# Patient Record
Sex: Female | Born: 1995 | Race: White | Hispanic: No | Marital: Single | Smoking: Never smoker
Health system: Southern US, Community
[De-identification: ages and names within clinical notes are randomized; demographics above are authoritative.]

## PROBLEM LIST (undated history)

## (undated) DIAGNOSIS — F329 Major depressive disorder, single episode, unspecified: Secondary | ICD-10-CM

## (undated) DIAGNOSIS — B977 Papillomavirus as the cause of diseases classified elsewhere: Secondary | ICD-10-CM

## (undated) DIAGNOSIS — F32A Depression, unspecified: Secondary | ICD-10-CM

## (undated) DIAGNOSIS — F419 Anxiety disorder, unspecified: Secondary | ICD-10-CM

## (undated) DIAGNOSIS — E079 Disorder of thyroid, unspecified: Secondary | ICD-10-CM

## (undated) DIAGNOSIS — E119 Type 2 diabetes mellitus without complications: Secondary | ICD-10-CM

## (undated) HISTORY — DX: Anxiety disorder, unspecified: F41.9

## (undated) HISTORY — PX: INTRAUTERINE DEVICE INSERTION: SHX323

## (undated) HISTORY — DX: Major depressive disorder, single episode, unspecified: F32.9

## (undated) HISTORY — DX: Depression, unspecified: F32.A

## (undated) HISTORY — DX: Papillomavirus as the cause of diseases classified elsewhere: B97.7

---
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
## 2017-02-24 ENCOUNTER — Ambulatory Visit (INDEPENDENT_AMBULATORY_CARE_PROVIDER_SITE_OTHER): Payer: BLUE CROSS/BLUE SHIELD | Admitting: Certified Nurse Midwife

## 2017-02-24 ENCOUNTER — Other Ambulatory Visit (HOSPITAL_COMMUNITY)
Admission: RE | Admit: 2017-02-24 | Discharge: 2017-02-24 | Disposition: A | Discharge status: Home / Self Care | Payer: BLUE CROSS/BLUE SHIELD | Source: Ambulatory Visit | Attending: Certified Nurse Midwife | Admitting: Certified Nurse Midwife

## 2017-02-24 ENCOUNTER — Encounter: Payer: Self-pay | Admitting: Certified Nurse Midwife

## 2017-02-24 VITALS — BP 102/64 | HR 64 | Resp 16 | Ht 65.5 in | Wt 151.0 lb

## 2017-02-24 DIAGNOSIS — Z794 Long term (current) use of insulin: Secondary | ICD-10-CM | POA: Diagnosis not present

## 2017-02-24 DIAGNOSIS — B9689 Other specified bacterial agents as the cause of diseases classified elsewhere: Secondary | ICD-10-CM | POA: Insufficient documentation

## 2017-02-24 DIAGNOSIS — IMO0001 Reserved for inherently not codable concepts without codable children: Secondary | ICD-10-CM

## 2017-02-24 DIAGNOSIS — E119 Type 2 diabetes mellitus without complications: Secondary | ICD-10-CM | POA: Diagnosis not present

## 2017-02-24 DIAGNOSIS — Z01419 Encounter for gynecological examination (general) (routine) without abnormal findings: Secondary | ICD-10-CM | POA: Diagnosis not present

## 2017-02-24 DIAGNOSIS — Z0000 Encounter for general adult medical examination without abnormal findings: Secondary | ICD-10-CM

## 2017-02-24 DIAGNOSIS — N760 Acute vaginitis: Secondary | ICD-10-CM | POA: Diagnosis not present

## 2017-02-24 NOTE — Progress Notes (Signed)
21 y.o. G0P0000 Single  Caucasian Fe here to establish gyn care and  for annual exam. Periods are regular with only spotting with Kyleena IUD, inserted 7/17. Also requests STD screening, labs and oral screening. Patient is Juvenile  insulin dependent diabetic with onset at age 52. She is in good control with her diabetes. Patient is here in [place] for college at [employer]. Hypothyroid also being managed by Endocrine in [facility]. Patient would like to establish Endocrine care here, if needed. Denies change in vaginal discharge or vaginal concerns. No other concerns today.  Patient's last menstrual period was 02/19/2017 (exact date).          Sexually active: Yes.    The current method of family planning is IUD.    Exercising: No.  exercise Smoker:  no  Health Maintenance: Pap:  none History of Abnormal Pap: no MMG:  none Self Breast exams: no Colonoscopy:  none BMD:   none TDaP:  UTD Shingles: no Pneumonia: no Hep C and HIV: not done Labs: requested   reports that she has never smoked. She has never used smokeless tobacco. She reports that she does not drink alcohol or use drugs.  Past Medical History:  Diagnosis Date  . Anxiety   . Depression   . Diabetes mellitus without complication (HCC)   . Thyroid disease     History reviewed. No pertinent surgical history.  Current Outpatient Prescriptions  Medication Sig Dispense Refill  . buPROPion (WELLBUTRIN XL) 300 MG 24 hr tablet Take 300 mg by mouth daily.    Marland Kitchen ibuprofen (ADVIL,MOTRIN) 200 MG tablet Take 200-400 mg by mouth every 6 (six) hours as needed (pain).    . insulin aspart (NOVOLOG FLEXPEN) 100 UNIT/ML FlexPen Inject 1-4 Units into the skin 3 (three) times daily with meals. 15 mL 2  . Insulin Glargine (BASAGLAR KWIKPEN) 100 UNIT/ML SOPN Inject 0.22 mLs (22 Units total) into the skin daily. 3 mL 2  . Levonorgestrel (KYLEENA) 19.5 MG IUD by Intrauterine route.    Marland Kitchen SYNTHROID 175 MCG tablet Take 175 mg by mouth  daily.    Marland Kitchen VYVANSE 20 MG capsule Take 20 mg by mouth daily.     No current facility-administered medications for this visit.     History reviewed. No pertinent family history.  ROS:  Pertinent items are noted in HPI.  Otherwise, a comprehensive ROS was negative.  Exam:   BP 102/64   Pulse 64   Resp 16   Ht 5' 5.5" (1.664 m)   Wt 151 lb (68.5 kg)   LMP 02/19/2017 (Exact Date)   BMI 24.75 kg/m  Height: 5' 5.5" (166.4 cm) Ht Readings from Last 3 Encounters:  02/24/17 5' 5.5" (1.664 m) (68 %, Z= 0.48)*  11/01/16 5\' 6"  (1.676 m) (75 %, Z= 0.68)*   * Growth percentiles are based on CDC 2-20 Years data.    General appearance: alert, cooperative and appears stated age Head: Normocephalic, without obvious abnormality, atraumatic Neck: no adenopathy, supple, symmetrical, trachea midline and thyroid normal to inspection and palpation Lungs: clear to auscultation bilaterally Breasts: normal appearance, no masses or tenderness, No nipple retraction or dimpling, No nipple discharge or bleeding, No axillary or supraclavicular adenopathy, Taught monthly breast self examination Heart: regular rate and rhythm Abdomen: soft, non-tender; no masses,  no organomegaly Extremities: extremities normal, atraumatic, no cyanosis or edema Skin: Skin color, texture, turgor normal. No rashes or lesions Lymph nodes: Cervical, supraclavicular, and axillary nodes normal. No abnormal inguinal nodes  palpated Neurologic: Grossly normal   Pelvic: External genitalia:  no lesions              Urethra:  normal appearing urethra with no masses, tenderness or lesions              Bartholin's and Skene's: normal                 Vagina: normal appearing vagina with normal color and discharge, no lesions              Cervix: no cervical motion tenderness, no lesions and nulliparous appearance              Pap taken: No. Bimanual Exam:  Uterus:  normal size, contour, position, consistency, mobility, non-tender and  anteverted              Adnexa: normal adnexa and no mass, fullness, tenderness               Rectovaginal: Confirms               Anus:  normal appearance  Chaperone present: yes  A:  Well Woman with normal exam  Contraception Kyleena IUD inserted 7/17  Insulin dependent Juvenile onset diabetes well controlled  Hypothyroid with Endocrine management  Screening labs  P:   Reviewed health and wellness pertinent to exam  Warning signs with IUD reviewed with bleeding expectations  Discussed with patient care here while in college and patient requesting Endocrine referral for establishment of care if problems with diabetes or thyroid. Patient will be called with referral appointment. If has problem before seen, needs to go ER or 911.  Labs: STD panel, Hep C, GC,Chlamydia vaginal, oral, Yeast, BV and trichomonas  Pap smear: no   counseled on breast self exam, STD prevention, HIV risk factors and prevention, adequate intake of calcium and vitamin D, diet and exercise  return annually or prn  An After Visit Summary was printed and given to the patient.

## 2017-02-24 NOTE — Patient Instructions (Signed)
General topics  Next pap or exam is  due in 1 year Take a Women's multivitamin Take 1200 mg. of calcium daily - prefer dietary If any concerns in interim to call back  Breast Self-Awareness Practicing breast self-awareness may pick up problems early, prevent significant medical complications, and possibly save your life. By practicing breast self-awareness, you can become familiar with how your breasts look and feel and if your breasts are changing. This allows you to notice changes early. It can also offer you some reassurance that your breast health is good. One way to learn what is normal for your breasts and whether your breasts are changing is to do a breast self-exam. If you find a lump or something that was not present in the past, it is best to contact your caregiver right away. Other findings that should be evaluated by your caregiver include nipple discharge, especially if it is bloody; skin changes or reddening; areas where the skin seems to be pulled in (retracted); or new lumps and bumps. Breast pain is seldom associated with cancer (malignancy), but should also be evaluated by a caregiver. BREAST SELF-EXAM The best time to examine your breasts is 5 7 days after your menstrual period is over.  ExitCare Patient Information 2013 [employer] LLC.   Exercise to Stay Healthy Exercise helps you become and stay healthy. EXERCISE IDEAS AND TIPS Choose exercises that:  You enjoy.  Fit into your day. You do not need to exercise really hard to be healthy. You can do exercises at a slow or medium level and stay healthy. You can:  Stretch before and after working out.  Try yoga, Pilates, or tai chi.  Lift weights.  Walk fast, swim, jog, run, climb stairs, bicycle, dance, or rollerskate.  Take aerobic classes. Exercises that burn about 150 calories:  Running 1  miles in 15 minutes.  Playing volleyball for 45 to 60 minutes.  Washing and waxing a car for 45 to 60  minutes.  Playing touch football for 45 minutes.  Walking 1  miles in 35 minutes.  Pushing a stroller 1  miles in 30 minutes.  Playing basketball for 30 minutes.  Raking leaves for 30 minutes.  Bicycling 5 miles in 30 minutes.  Walking 2 miles in 30 minutes.  Dancing for 30 minutes.  Shoveling snow for 15 minutes.  Swimming laps for 20 minutes.  Walking up stairs for 15 minutes.  Bicycling 4 miles in 15 minutes.  Gardening for 30 to 45 minutes.  Jumping rope for 15 minutes.  Washing windows or floors for 45 to 60 minutes. Document Released: 07/24/2010 Document Revised: 09/13/2011 Document Reviewed: 07/24/2010 ExitCare Patient Information 2013 ExitCare[employer].   Other topics ( that may be useful information):    Sexually Transmitted Disease Sexually transmitted disease (STD) refers to any infection that is passed from person to person during sexual activity. This may happen by way of saliva, semen, blood, vaginal mucus, or urine. Common STDs include:  Gonorrhea.  Chlamydia.  Syphilis.  HIV/AIDS.  Genital herpes.  Hepatitis B and C.  Trichomonas.  Human papillomavirus (HPV).  Pubic lice. CAUSES  An STD may be spread by bacteria, virus, or parasite. A person can get an STD by:  Sexual intercourse with an infected person.  Sharing sex toys with an infected person.  Sharing needles with an infected person.  Having intimate contact with the genitals, mouth, or rectal areas of an infected person. SYMPTOMS  Some people may not have any symptoms, but   they can still pass the infection to others. Different STDs have different symptoms. Symptoms include:  Painful or bloody urination.  Pain in the pelvis, abdomen, vagina, anus, throat, or eyes.  Skin rash, itching, irritation, growths, or sores (lesions). These usually occur in the genital or anal area.  Abnormal vaginal discharge.  Penile discharge in men.  Soft, flesh-colored skin growths in the  genital or anal area.  Fever.  Pain or bleeding during sexual intercourse.  Swollen glands in the groin area.  Yellow skin and eyes (jaundice). This is seen with hepatitis. DIAGNOSIS  To make a diagnosis, your caregiver may:  Take a medical history.  Perform a physical exam.  Take a specimen (culture) to be examined.  Examine a sample of discharge under a microscope.  Perform blood test TREATMENT   Chlamydia, gonorrhea, trichomonas, and syphilis can be cured with antibiotic medicine.  Genital herpes, hepatitis, and HIV can be treated, but not cured, with prescribed medicines. The medicines will lessen the symptoms.  Genital warts from HPV can be treated with medicine or by freezing, burning (electrocautery), or surgery. Warts may come back.  HPV is a virus and cannot be cured with medicine or surgery.However, abnormal areas may be followed very closely by your caregiver and may be removed from the cervix, vagina, or vulva through office procedures or surgery. If your diagnosis is confirmed, your recent sexual partners need treatment. This is true even if they are symptom-free or have a negative culture or evaluation. They should not have sex until their caregiver says it is okay. HOME CARE INSTRUCTIONS  All sexual partners should be informed, tested, and treated for all STDs.  Take your antibiotics as directed. Finish them even if you start to feel better.  Only take over-the-counter or prescription medicines for pain, discomfort, or fever as directed by your caregiver.  Rest.  Eat a balanced diet and drink enough fluids to keep your urine clear or pale yellow.  Do not have sex until treatment is completed and you have followed up with your caregiver. STDs should be checked after treatment.  Keep all follow-up appointments, Pap tests, and blood tests as directed by your caregiver.  Only use latex condoms and water-soluble lubricants during sexual activity. Do not use  petroleum jelly or oils.  Avoid alcohol and illegal drugs.  Get vaccinated for HPV and hepatitis. If you have not received these vaccines in the past, talk to your caregiver about whether one or both might be right for you.  Avoid risky sex practices that can break the skin. The only way to avoid getting an STD is to avoid all sexual activity.Latex condoms and dental dams (for oral sex) will help lessen the risk of getting an STD, but will not completely eliminate the risk. SEEK MEDICAL CARE IF:   You have a fever.  You have any new or worsening symptoms. Document Released: 09/11/2002 Document Revised: 09/13/2011 Document Reviewed: 09/18/2010 [facility] Patient Information 2013 [employer].    Domestic Abuse You are being battered or abused if someone close to you hits, pushes, or physically hurts you in any way. You also are being abused if you are forced into activities. You are being sexually abused if you are forced to have sexual contact of any kind. You are being emotionally abused if you are made to feel worthless or if you are constantly threatened. It is important to remember that help is available. No one has the right to abuse you. PREVENTION OF FURTHER  ABUSE  Learn the warning signs of danger. This varies with situations but may include: the use of alcohol, threats, isolation from friends and family, or forced sexual contact. Leave if you feel that violence is going to occur.  If you are attacked or beaten, report it to the police so the abuse is documented. You do not have to press charges. The police can protect you while you or the attackers are leaving. Get the officer's name and badge number and a copy of the report.  Find someone you can trust and tell them what is happening to you: your caregiver, a nurse, clergy member, close friend or family member. Feeling ashamed is natural, but remember that you have done nothing wrong. No one deserves abuse. Document Released:  06/18/2000 Document Revised: 09/13/2011 Document Reviewed: 08/27/2010 ExitCare Patient Information 2013 ExitCare[employer].    How Much is Too Much Alcohol? Drinking too much alcohol can cause injury, accidents, and health problems. These types of problems can include:   Car crashes.  Falls.  Family fighting (domestic violence).  Drowning.  Fights.  Injuries.  Burns.  Damage to certain organs.  Having a baby with birth defects. ONE DRINK CAN BE TOO MUCH WHEN YOU ARE:  Working.  Pregnant or breastfeeding.  Taking medicines. Ask your doctor.  Driving or planning to drive. If you or someone you know has a drinking problem, get help from a doctor.  Document Released: 04/17/2009 Document Revised: 09/13/2011 Document Reviewed: 04/17/2009 ExitCare Patient Information 2013 [employer]Care[employer].   Smoking Hazards Smoking cigarettes is extremely bad for your health. Tobacco smoke has over 200 known poisons in it. There are over 60 chemicals in tobacco smoke that cause cancer. Some of the chemicals found in cigarette smoke include:   Cyanide.  Benzene.  Formaldehyde.  Methanol (wood alcohol).  Acetylene (fuel used in welding torches).  Ammonia. Cigarette smoke also contains the poisonous gases nitrogen oxide and carbon monoxide.  Cigarette smokers have an increased risk of many serious medical problems and Smoking causes approximately:  90% of all lung cancer deaths in men.  80% of all lung cancer deaths in women.  90% of deaths from chronic obstructive lung disease. Compared with nonsmokers, smoking increases the risk of:  Coronary heart disease by 2 to 4 times.  Stroke by 2 to 4 times.  Men developing lung cancer by 23 times.  Women developing lung cancer by 13 times.  Dying from chronic obstructive lung diseases by 12 times.  . Smoking is the most preventable cause of death and disease in our society.  WHY IS SMOKING ADDICTIVE?  Nicotine is the chemical  agent in tobacco that is capable of causing addiction or dependence.  When you smoke and inhale, nicotine is absorbed rapidly into the bloodstream through your lungs. Nicotine absorbed through the lungs is capable of creating a powerful addiction. Both inhaled and non-inhaled nicotine may be addictive.  Addiction studies of cigarettes and spit tobacco show that addiction to nicotine occurs mainly during the teen years, when young people begin using tobacco products. WHAT ARE THE BENEFITS OF QUITTING?  There are many health benefits to quitting smoking.   Likelihood of developing cancer and heart disease decreases. Health improvements are seen almost immediately.  Blood pressure, pulse rate, and breathing patterns start returning to normal soon after quitting. QUITTING SMOKING   American Lung Association - 1-800-LUNGUSA  American Cancer Society - 1-800-ACS-2345 Document Released: 07/29/2004 Document Revised: 09/13/2011 Document Reviewed: 04/02/2009 ExitCare Patient Information 2013 ExitCare,   LLC.   Stress Management Stress is a state of physical or mental tension that often results from changes in your life or normal routine. Some common causes of stress are:  Death of a loved one.  Injuries or severe illnesses.  Getting fired or changing jobs.  Moving into a new home. Other causes may be:  Sexual problems.  Business or financial losses.  Taking on a large debt.  Regular conflict with someone at home or at work.  Constant tiredness from lack of sleep. It is not just bad things that are stressful. It may be stressful to:  Win the lottery.  Get married.  Buy a new car. The amount of stress that can be easily tolerated varies from person to person. Changes generally cause stress, regardless of the types of change. Too much stress can affect your health. It may lead to physical or emotional problems. Too little stress (boredom) may also become stressful. SUGGESTIONS TO  REDUCE STRESS:  Talk things over with your family and friends. It often is helpful to share your concerns and worries. If you feel your problem is serious, you may want to get help from a professional counselor.  Consider your problems one at a time instead of lumping them all together. Trying to take care of everything at once may seem impossible. List all the things you need to do and then start with the most important one. Set a goal to accomplish 2 or 3 things each day. If you expect to do too many in a single day you will naturally fail, causing you to feel even more stressed.  Do not use alcohol or drugs to relieve stress. Although you may feel better for a short time, they do not remove the problems that caused the stress. They can also be habit forming.  Exercise regularly - at least 3 times per week. Physical exercise can help to relieve that "uptight" feeling and will relax you.  The shortest distance between despair and hope is often a good night's sleep.  Go to bed and get up on time allowing yourself time for appointments without being rushed.  Take a short "time-out" period from any stressful situation that occurs during the day. Close your eyes and take some deep breaths. Starting with the muscles in your face, tense them, hold it for a few seconds, then relax. Repeat this with the muscles in your neck, shoulders, hand, stomach, back and legs.  Take good care of yourself. Eat a balanced diet and get plenty of rest.  Schedule time for having fun. Take a break from your daily routine to relax. HOME CARE INSTRUCTIONS   Call if you feel overwhelmed by your problems and feel you can no longer manage them on your own.  Return immediately if you feel like hurting yourself or someone else. Document Released: 12/15/2000 Document Revised: 09/13/2011 Document Reviewed: 08/07/2007 ExitCare Patient Information 2013 ExitCare, LLC.   

## 2017-02-25 LAB — HEP, RPR, HIV PANEL
HIV Screen 4th Generation wRfx: NONREACTIVE
Hepatitis B Surface Ag: NEGATIVE
RPR Ser Ql: NONREACTIVE

## 2017-02-25 LAB — CERVICOVAGINAL ANCILLARY ONLY
Bacterial vaginitis: POSITIVE — AB
CANDIDA VAGINITIS: NEGATIVE
CHLAMYDIA, DNA PROBE: NEGATIVE
NEISSERIA GONORRHEA: NEGATIVE
Trichomonas: NEGATIVE

## 2017-02-25 LAB — HEPATITIS C ANTIBODY: Hep C Virus Ab: <0.1 s/co ratio (ref 0.0–0.9)

## 2017-02-27 LAB — CT/GC NAA, PHARYNGEAL
C TRACH RRNA NPH QL PCR: NEGATIVE
N GONORRHOEA RRNA NPH QL PCR: NEGATIVE

## 2017-02-28 LAB — CERVICOVAGINAL ANCILLARY ONLY: HPV: DETECTED — AB

## 2017-02-28 MED ORDER — METRONIDAZOLE 500 MG PO TABS: 500.0000 mg | ORAL_TABLET | Freq: Two times a day (BID) | ORAL | 0 refills | Status: DC

## 2017-02-28 NOTE — Addendum Note (Signed)
Addended by: Luisa Dago on: 02/28/2017 02:47 PM   Modules accepted: Orders

## 2017-03-04 ENCOUNTER — Telehealth: Payer: Self-pay | Admitting: *Deleted

## 2017-03-04 ENCOUNTER — Other Ambulatory Visit: Payer: Self-pay | Admitting: Certified Nurse Midwife

## 2017-03-04 DIAGNOSIS — Z794 Long term (current) use of insulin: Secondary | ICD-10-CM

## 2017-03-04 DIAGNOSIS — IMO0001 Reserved for inherently not codable concepts without codable children: Secondary | ICD-10-CM

## 2017-03-04 DIAGNOSIS — E119 Type 2 diabetes mellitus without complications: Secondary | ICD-10-CM

## 2017-03-04 DIAGNOSIS — E039 Hypothyroidism, unspecified: Secondary | ICD-10-CM

## 2017-03-04 NOTE — Telephone Encounter (Signed)
Notes recorded by Leda MinHamm, Jolina Symonds N, RN on 03/04/2017 at 1:13 PM EDT Left message to call Noreene LarssonJill at [phone]. See telephone encounter dated 03/04/17.   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

## 2017-03-04 NOTE — Telephone Encounter (Signed)
Spoke with patient, advised as seen below per Leota Sauerseborah Leonard, CNM. Brief explanation of HPV provided. Patient scheduled for OV with Leota Sauerseborah Leonard, CNM On 03/08/17 at 2pm for further discussion on HPV. Patient verbalizes understanding and is agreeable.  Patient is agreeable to disposition. Will close encounter.

## 2017-03-08 ENCOUNTER — Ambulatory Visit (INDEPENDENT_AMBULATORY_CARE_PROVIDER_SITE_OTHER): Payer: BLUE CROSS/BLUE SHIELD | Admitting: Certified Nurse Midwife

## 2017-03-08 ENCOUNTER — Encounter: Payer: Self-pay | Admitting: Certified Nurse Midwife

## 2017-03-08 VITALS — BP 118/72 | HR 76 | Resp 16 | Ht 65.5 in | Wt 154.0 lb

## 2017-03-08 DIAGNOSIS — Z202 Contact with and (suspected) exposure to infections with a predominantly sexual mode of transmission: Secondary | ICD-10-CM

## 2017-03-08 NOTE — Patient Instructions (Signed)
Human Papillomavirus Human papillomavirus (HPV) is the most common sexually transmitted infection (STI). It is easy to pass it from person to person (contagious). HPV can cause cervical cancer, anal cancer, and genital warts. The genital warts can be seen and felt. Also, there may be wartlike regions in the throat. HPV may not have any symptoms. It is possible to have HPV for a long time and not know it. You may pass HPV on to others without knowing it. Follow these instructions at home:  Take medicines as told by your doctor.  Use over-the-counter creams for itching as told by your doctor.  Keep all follow-up visits. Make sure to get Pap tests as told by your doctor.  Do not touch or scratch the warts.  Do not treat genital warts with medicines used for treating hand warts.  Do not have sex while you are getting treatment.  Do not douche or use tampons during treatment of HPV.  Tell your sex partner about your infection because he or she may also need treatment.  If you get pregnant, tell your doctor that you had HPV. Your doctor will watch your pregnancy closely. This is important to keep your baby safe.  After treatment, use condoms during sex to prevent future infections.  Have only one sex partner.  Have a sex partner who does not have other sex partners. Contact a doctor if:  The treated skin is red, swollen, or painful.  You have a fever.  You feel ill.  You feel lumps or pimple-like areas in and around your genital area.  You have bleeding of the vagina or the area that was treated.  You have pain during sex. This information is not intended to replace advice given to you by your health care provider. Make sure you discuss any questions you have with your health care provider. Document Released: 06/03/2008 Document Revised: 11/27/2015 Document Reviewed: 09/26/2013 Elsevier Interactive Patient Education  2017 Elsevier Inc.  

## 2017-03-08 NOTE — Progress Notes (Signed)
21 y.o. Single Caucasian female [id number] here to discuss HPV and BV finding on STD screening and vaginal screen only.. Patient has taken the Flagyl recommended for BV without problems. She would like more information on HPV. She is aware it is sexually transmitted and she also acknowledges she completed the vaccine series. More than one sexual partner in her lifetime and with current partner also. Has never noted any type of skin growth with warty appearance on her genital or his genital area. Patient is an insulin dependent diabetic, well controled and has hypothyroidism well controlled. Student at [employer]. No health issues today.   O: Healthy WD,WN female Affect: normal, orientation x 3   A:BV on vaginal screen treated with Flagyl HPV on vaginal screen no pap    P: Discussed findings of BV and normal flora in vagina, unless ph imbalance which can lead to overgrowth and symptoms of increase vaginal discharge, burning or odor, which was occurring at her initial visit. This has resolved with completion of Flagyl.Questions addressed to etiology.  Discussed HPV etiology and commonly found in sexually active population and also with non monogamous relationships. Discussed Gardasil role in prevention of HPVHR changes of the cervix. Also discussed pap smears not recommended until age 19 due to spontaneous resolution of HPV presence in young women and men up to age 19. Discussed prevention of exposure with condom use. Questions answered at length and printed information given.  27 minutes in face to face discussion regarding STD prevention and etiology of STD.

## 2017-03-25 ENCOUNTER — Encounter: Payer: Self-pay | Admitting: Certified Nurse Midwife

## 2017-03-25 ENCOUNTER — Ambulatory Visit: Payer: BLUE CROSS/BLUE SHIELD | Admitting: Certified Nurse Midwife

## 2017-03-25 ENCOUNTER — Telehealth: Payer: Self-pay | Admitting: Certified Nurse Midwife

## 2017-03-25 NOTE — Telephone Encounter (Signed)
Left message on voicemail regarding missed appointment from today.

## 2017-04-07 ENCOUNTER — Encounter: Payer: Self-pay | Admitting: Endocrinology

## 2017-04-07 ENCOUNTER — Ambulatory Visit (INDEPENDENT_AMBULATORY_CARE_PROVIDER_SITE_OTHER): Payer: BLUE CROSS/BLUE SHIELD | Admitting: Endocrinology

## 2017-04-07 ENCOUNTER — Other Ambulatory Visit: Payer: Self-pay

## 2017-04-07 DIAGNOSIS — E109 Type 1 diabetes mellitus without complications: Secondary | ICD-10-CM | POA: Diagnosis not present

## 2017-04-07 DIAGNOSIS — Z23 Encounter for immunization: Secondary | ICD-10-CM

## 2017-04-07 DIAGNOSIS — E119 Type 2 diabetes mellitus without complications: Secondary | ICD-10-CM | POA: Insufficient documentation

## 2017-04-07 MED ORDER — SYNTHROID 175 MCG PO TABS: 175.0000 ug | ORAL_TABLET | Freq: Every day | ORAL | 3 refills | Status: DC

## 2017-04-07 MED ORDER — BASAGLAR KWIKPEN 100 UNIT/ML ~~LOC~~ SOPN: 22.0000 [IU] | PEN_INJECTOR | Freq: Every day | SUBCUTANEOUS | 11 refills | Status: DC

## 2017-04-07 MED ORDER — GLUCOSE BLOOD VI STRP: 1.0000 | ORAL_STRIP | Freq: Every day | 3 refills | Status: DC

## 2017-04-07 MED ORDER — INSULIN ASPART 100 UNIT/ML FLEXPEN: PEN_INJECTOR | SUBCUTANEOUS | 11 refills | Status: DC

## 2017-04-07 NOTE — Patient Instructions (Addendum)
good diet and exercise significantly improve the control of your diabetes.  please let me know if you wish to be referred to a dietician.  high blood sugar is very risky to your health.  you should see an eye doctor and dentist every year.  It is very important to get all recommended vaccinations.  Controlling your blood pressure and cholesterol drastically reduces the damage diabetes does to your body.  Those who smoke should quit.  Please discuss these with your doctor.  check your blood sugar 4 times a day: before the 3 meals, and at bedtime.  also check if you have symptoms of your blood sugar being too high or too low.  please keep a record of the readings and bring it to your next appointment here (or you can bring the meter itself).  You can write it on any piece of paper.  please call us sooner if your blood sugar goes below 70, or if you have a lot of readings over 200. In view of your medical condition, you should avoid pregnancy until we have decided it is safe.   For now, please: Please continue the same basaglar, and: Take novolog, 1 unit/4 grams at breakfast and supper.  Take 1 unit/5 grams at lunch, and if you eat after supper.   Please watch a youtube video about the "freestyle libre."  Let us know if you want a prescription.   Please come back for a follow-up appointment in 1 month.

## 2017-04-07 NOTE — Progress Notes (Signed)
Subjective:    Patient ID: Casey Wolf, female    DOB: 05-24-1996, 21 y.o.   MRN: [id number]  HPI pt is referred by Dr Casey Wolf, for diabetes.  Pt states DM was dx'ed in 2012; she has mild if any neuropathy of the lower extremities; she is unaware of any associated chronic complications; she has been on insulin since dx; pt says her diet is good, but exercise is not good; she has never had GDM (G0), pancreatitis, pancreatic surgery, severe hypoglycemia or DKA.  She misses 1-2 doses per week.  She takes basalgar, 22 units/d, and novolog, 1 unit/4 grams CHO (averages 20 units per dose).  She has mild hypoglycemia approx 4 times per week.  This happens in the afternoon, but not necessarily with activity.  She says cbg's are highest fasting (higher than at hs, possibly due to eating at hs).  Past Medical History:  Diagnosis Date  . Anxiety   . Depression   . Diabetes mellitus without complication ([facility])   . HPV in female   . Thyroid disease     Past Surgical History:  Procedure Laterality Date  . INTRAUTERINE DEVICE INSERTION     Casey Wolf inserted 01-21-16?    Social History   Social History  . Marital status: Single    Spouse name: N/A  . Number of children: N/A  . Years of education: N/A   Occupational History  . Not on file.   Social History Main Topics  . Smoking status: Never Smoker  . Smokeless tobacco: Never Used  . Alcohol use No  . Drug use: No  . Sexual activity: Yes    Partners: Female, Female    Birth control/ protection: IUD   Other Topics Concern  . Not on file   Social History Narrative  . No narrative on file    Current Outpatient Prescriptions on File Prior to Visit  Medication Sig Dispense Refill  . buPROPion (WELLBUTRIN XL) 300 MG 24 hr tablet Take 300 mg by mouth daily.    Marland Kitchen ibuprofen (ADVIL,MOTRIN) 200 MG tablet Take 200-400 mg by mouth every 6 (six) hours as needed (pain).    . Levonorgestrel (Casey Wolf) 19.5 MG IUD by Intrauterine route.    .  metroNIDAZOLE (FLAGYL) 500 MG tablet Take 1 tablet (500 mg total) by mouth 2 (two) times daily. 14 tablet 0  . VYVANSE 20 MG capsule Take 20 mg by mouth daily.     No current facility-administered medications on file prior to visit.     Allergies  Allergen Reactions  . Amoxicillin Hives    Family History  Problem Relation Age of Onset  . Thyroid disease Father   . Diabetes Cousin     BP 110/66   Pulse 98   Wt 150 lb 3.2 oz (68.1 kg)   SpO2 94%   BMI 24.61 kg/m     Review of Systems denies weight loss, blurry vision, headache, chest pain, sob, n/v, urinary frequency, muscle cramps, excessive diaphoresis, cold intolerance, rhinorrhea, and easy bruising.   She has gained a few lbs.  She has depression.       Objective:   Physical Exam VS: see vs page GEN: no distress HEAD: head: no deformity eyes: no periorbital swelling, no proptosis external nose and ears are normal mouth: no lesion seen NECK: supple, thyroid is not enlarged CHEST WALL: no deformity LUNGS: clear to auscultation CV: reg rate and rhythm, no murmur ABD: abdomen is soft, nontender.  no hepatosplenomegaly.  not  distended.  no hernia MUSCULOSKELETAL: muscle bulk and strength are grossly normal.  no obvious joint swelling.  gait is normal and steady EXTEMITIES: no deformity.  no ulcer on the feet.  feet are of normal color and temp.  no edema PULSES: dorsalis pedis intact bilat.  no carotid bruit NEURO:  cn 2-12 grossly intact.   readily moves all 4's.  sensation is intact to touch on the feet SKIN:  Normal texture and temperature.  No rash or suspicious lesion is visible.   NODES:  None palpable at the neck PSYCH: alert, well-oriented.  Does not appear anxious nor depressed.  I have reviewed outside records, and summarized: Pt was noted to have elevated a1c, and referred here.  She was seen in ER April, 2018, for severe hyperglycemia, related to noncompliance   A1c=10.0%    Assessment & Plan:    Insulin-requiring type 2 DM: poor glycemic control.  she declines pump and continuous glucose monitor, at least for now.   Patient Instructions  good diet and exercise significantly improve the control of your diabetes.  please let me know if you wish to be referred to a dietician.  high blood sugar is very risky to your health.  you should see an eye doctor and dentist every year.  It is very important to get all recommended vaccinations.  Controlling your blood pressure and cholesterol drastically reduces the damage diabetes does to your body.  Those who smoke should quit.  Please discuss these with your doctor.  check your blood sugar 4 times a day: before the 3 meals, and at bedtime.  also check if you have symptoms of your blood sugar being too high or too low.  please keep a record of the readings and bring it to your next appointment here (or you can bring the meter itself).  You can write it on any piece of paper.  please call us sooner if your blood sugar goes below 70, or if you have a lot of readings over 200. In view of your medical condition, you should avoid pregnancy until we have decided it is safe.   For now, please: Please continue the same basaglar, and: Take novolog, 1 unit/4 grams at breakfast and supper.  Take 1 unit/5 grams at lunch, and if you eat after supper.   Please watch a youtube video about the "freestyle libre."  Let us know if you want a prescription.   Please come back for a follow-up appointment in 1 month.

## 2017-04-19 LAB — BLOOD GAS, VENOUS
Acid-Base Excess: 1.3 mmol/L (ref 0.0–2.0)
Bicarbonate: 26.9 mmol/L (ref 20.0–28.0)
O2 SAT: 59.7 %
PCO2 VEN: 49 mmHg (ref 44.0–60.0)
PH VEN: 7.359 (ref 7.250–7.430)
PO2 VEN: 36.2 mmHg (ref 32.0–45.0)
Patient temperature: 98.6

## 2017-05-09 ENCOUNTER — Ambulatory Visit: Payer: BLUE CROSS/BLUE SHIELD | Admitting: Endocrinology

## 2017-05-09 DIAGNOSIS — Z0289 Encounter for other administrative examinations: Secondary | ICD-10-CM

## 2017-08-26 ENCOUNTER — Telehealth: Payer: Self-pay | Admitting: Certified Nurse Midwife

## 2017-08-26 ENCOUNTER — Ambulatory Visit: Payer: BLUE CROSS/BLUE SHIELD | Admitting: Certified Nurse Midwife

## 2017-08-26 ENCOUNTER — Encounter: Payer: Self-pay | Admitting: Certified Nurse Midwife

## 2017-08-26 NOTE — Telephone Encounter (Signed)
Patient called and did not keep her appointment for std testing today because she overslept. She will call back to reschedule.

## 2017-09-12 NOTE — Telephone Encounter (Signed)
Called and left a message for patient to call back and reschedule this missed appointment for std testing.

## 2017-10-06 NOTE — Telephone Encounter (Signed)
Patient has not called back to reschedule. Okay to close encounter or is further follow up needed? °

## 2017-10-07 NOTE — Telephone Encounter (Signed)
Ok to close

## 2017-12-06 ENCOUNTER — Telehealth: Payer: Self-pay | Admitting: Endocrinology

## 2017-12-06 ENCOUNTER — Other Ambulatory Visit: Payer: Self-pay

## 2017-12-06 NOTE — Telephone Encounter (Signed)
Patient is returning call from our office,.

## 2017-12-12 ENCOUNTER — Telehealth: Payer: Self-pay | Admitting: Endocrinology

## 2017-12-12 NOTE — Telephone Encounter (Signed)
[id number] - [place], [place] - [address]   Patient called back and would like her medication sent to the above pharmacy

## 2017-12-12 NOTE — Telephone Encounter (Signed)
I called patient to find out what pharmacy she needed medication sent to. She didn't answer & LVM.

## 2017-12-12 NOTE — Telephone Encounter (Signed)
Patient request refill for medication Synthroid,stated she has been out of her medication for a week, she feels lethargic and low energy an feels like she is gaining weight.  [place]   Didn't say what pharmacy to send it to.  Called [facility]

## 2017-12-13 ENCOUNTER — Other Ambulatory Visit: Payer: Self-pay

## 2017-12-13 MED ORDER — SYNTHROID 175 MCG PO TABS: 175.0000 ug | ORAL_TABLET | Freq: Every day | ORAL | 3 refills | Status: DC

## 2017-12-13 NOTE — Telephone Encounter (Signed)
I have sent to patient's requested pharmacy.  

## 2018-01-18 ENCOUNTER — Other Ambulatory Visit: Payer: Self-pay

## 2018-01-18 ENCOUNTER — Telehealth: Payer: Self-pay | Admitting: Endocrinology

## 2018-01-18 MED ORDER — BASAGLAR KWIKPEN 100 UNIT/ML ~~LOC~~ SOPN: 22.0000 [IU] | PEN_INJECTOR | Freq: Every day | SUBCUTANEOUS | 11 refills | Status: DC

## 2018-01-18 NOTE — Telephone Encounter (Signed)
Patient [facility] RX [facility] [place] ph# [phone]

## 2018-01-18 NOTE — Telephone Encounter (Signed)
I have sent to patient's requested pharmacy.  

## 2018-01-25 ENCOUNTER — Telehealth: Payer: Self-pay | Admitting: Emergency Medicine

## 2018-01-25 NOTE — Telephone Encounter (Signed)
Error

## 2018-01-25 NOTE — Telephone Encounter (Signed)
Pt called and wants to know if she can get Test strips and lancets for the accu check guide. [facility] [address] [place]. Thanks.

## 2018-01-26 ENCOUNTER — Other Ambulatory Visit: Payer: Self-pay

## 2018-01-26 MED ORDER — ACCU-CHEK GUIDE W/DEVICE KIT: 1.0000 | PACK | Freq: Every day | 0 refills | Status: DC | PRN

## 2018-01-26 MED ORDER — ACCU-CHEK MULTICLIX LANCETS MISC: 12 refills | Status: DC

## 2018-01-26 MED ORDER — GLUCOSE BLOOD VI STRP: ORAL_STRIP | 12 refills | Status: DC

## 2018-01-26 NOTE — Telephone Encounter (Signed)
[facility] [place].

## 2018-03-09 ENCOUNTER — Other Ambulatory Visit: Payer: Self-pay | Admitting: Endocrinology

## 2018-03-11 ENCOUNTER — Emergency Department (HOSPITAL_COMMUNITY)
Admission: EM | Admit: 2018-03-11 | Discharge: 2018-03-11 | Disposition: A | Discharge status: Home / Self Care | Payer: BLUE CROSS/BLUE SHIELD | Attending: Emergency Medicine | Admitting: Emergency Medicine

## 2018-03-11 ENCOUNTER — Encounter (HOSPITAL_COMMUNITY): Payer: Self-pay | Admitting: *Deleted

## 2018-03-11 ENCOUNTER — Other Ambulatory Visit: Payer: Self-pay

## 2018-03-11 DIAGNOSIS — E079 Disorder of thyroid, unspecified: Secondary | ICD-10-CM | POA: Diagnosis not present

## 2018-03-11 DIAGNOSIS — S61411A Laceration without foreign body of right hand, initial encounter: Secondary | ICD-10-CM

## 2018-03-11 DIAGNOSIS — Z79899 Other long term (current) drug therapy: Secondary | ICD-10-CM | POA: Diagnosis not present

## 2018-03-11 DIAGNOSIS — Z23 Encounter for immunization: Secondary | ICD-10-CM | POA: Diagnosis not present

## 2018-03-11 DIAGNOSIS — E119 Type 2 diabetes mellitus without complications: Secondary | ICD-10-CM | POA: Diagnosis not present

## 2018-03-11 DIAGNOSIS — S6991XA Unspecified injury of right wrist, hand and finger(s), initial encounter: Secondary | ICD-10-CM | POA: Diagnosis present

## 2018-03-11 DIAGNOSIS — W260XXA Contact with knife, initial encounter: Secondary | ICD-10-CM | POA: Insufficient documentation

## 2018-03-11 DIAGNOSIS — Y998 Other external cause status: Secondary | ICD-10-CM | POA: Diagnosis not present

## 2018-03-11 DIAGNOSIS — Z794 Long term (current) use of insulin: Secondary | ICD-10-CM | POA: Insufficient documentation

## 2018-03-11 DIAGNOSIS — Y929 Unspecified place or not applicable: Secondary | ICD-10-CM | POA: Insufficient documentation

## 2018-03-11 DIAGNOSIS — Y9389 Activity, other specified: Secondary | ICD-10-CM | POA: Diagnosis not present

## 2018-03-11 MED ORDER — CEPHALEXIN 500 MG PO CAPS: 500.0000 mg | ORAL_CAPSULE | Freq: Four times a day (QID) | ORAL | 0 refills | Status: DC

## 2018-03-11 MED ORDER — LIDOCAINE-EPINEPHRINE 1 %-1:100000 IJ SOLN
10.0000 mL | Freq: Once | INTRAMUSCULAR | Status: AC
  Administered 2018-03-11: 10 mL
  Filled 2018-03-11: qty 10

## 2018-03-11 MED ORDER — TETANUS-DIPHTH-ACELL PERTUSSIS 5-2.5-18.5 LF-MCG/0.5 IM SUSP
0.5000 mL | Freq: Once | INTRAMUSCULAR | Status: AC
  Administered 2018-03-11: 0.5 mL via INTRAMUSCULAR
  Filled 2018-03-11: qty 0.5

## 2018-03-11 NOTE — ED Triage Notes (Signed)
Pt arrives with c/o laceration to the palm of the right hand. She says she was cutting paper and a friend started play wrestling with her and she accidentally cut her right hand. Only small amount of bleeding noted in triage. ETOH tonight.

## 2018-03-11 NOTE — Discharge Instructions (Addendum)
Apply topical bacitracin twice a day to prevent infection.  Do not place your hand in any stagnant water such as while bathing or swimming.  You may shower normally.  Take ibuprofen as needed for pain control.  Have your stitches removed in 2 weeks by your primary care doctor.  You may return if symptoms worsen or signs of infection develop.

## 2018-03-11 NOTE — ED Provider Notes (Signed)
[facility] DEPT Provider Note   CSN: [id number] Arrival date & time: 03/11/18  0140     History   Chief Complaint Chief Complaint  Patient presents with  . Laceration    HPI Casey Wolf is a 22 y.o. female.  22 year old female presents to the emergency department for evaluation of laceration to the palm of her right hand.  This occurred approximately 6 hours ago.  She states that she was cutting paper and a friend started play wrestling with her.  She accidentally cut her hand on a knife.  Pressure applied with some improvement to bleeding.  Minimal amount of bleeding noted in triage.  She has been drinking alcohol tonight.  Reports some tenderness to laceration site.  Not on any chronic anticoagulants.  Unsure of her last tetanus shot.     Past Medical History:  Diagnosis Date  . Anxiety   . Depression   . Diabetes mellitus without complication ([facility])   . HPV in female   . Thyroid disease     Patient Active Problem List   Diagnosis Date Noted  . Diabetes ([facility]) 04/07/2017    Past Surgical History:  Procedure Laterality Date  . INTRAUTERINE DEVICE INSERTION     kyleena inserted 01-21-16?     OB History    Gravida  0   Para  0   Term  0   Preterm  0   AB  0   Living  0     SAB  0   TAB  0   Ectopic  0   Multiple  0   Live Births  0            Home Medications    Prior to Admission medications   Medication Sig Start Date End Date Taking? Authorizing Provider  buPROPion (WELLBUTRIN XL) 150 MG 24 hr tablet Take 150 mg by mouth daily.   Yes [provider]  insulin aspart (NOVOLOG FLEXPEN) 100 UNIT/ML FlexPen Per carbohydrate ratio, for a total of 70 units/day, and pen needles 5/day Patient taking differently: Inject 4-20 Units into the skin 5 (five) times daily. Sliding scale per carbohydrate ratio, for a total of 70 units/day, and pen needles 5/day 04/07/17  Yes Renato Shin, MD  Insulin Glargine  (BASAGLAR KWIKPEN) 100 UNIT/ML SOPN Inject 0.22 mLs (22 Units total) into the skin daily. 01/18/18  Yes Renato Shin, MD  Levonorgestrel [facility]) 19.5 MG IUD 1 each by Intrauterine route daily.    Yes [provider]  SYNTHROID 175 MCG tablet Take 1 tablet (175 mcg total) by mouth daily. 12/13/17  Yes Renato Shin, MD  Blood Glucose Monitoring Suppl (ACCU-CHEK GUIDE) w/Device KIT 1 Device by Does not apply route daily as needed. 01/26/18   Renato Shin, MD  cephALEXin (KEFLEX) 500 MG capsule Take 1 capsule (500 mg total) by mouth 4 (four) times daily. Take as prescribed until finished for infection prevention. 03/11/18   Antonietta Breach, PA-C  glucose blood (ACCU-CHEK GUIDE) test strip Used to check blood sugars four times daily. 01/26/18   Renato Shin, MD  Lancets (ACCU-CHEK MULTICLIX) lancets Used to check blood sugars four times daily. 01/26/18   Renato Shin, MD  metroNIDAZOLE (FLAGYL) 500 MG tablet Take 1 tablet (500 mg total) by mouth 2 (two) times daily. Patient not taking: Reported on 03/11/2018 02/28/17   Regina Eck, CNM    Family History Family History  Problem Relation Age of Onset  . Thyroid disease Father   .  Diabetes Cousin     Social History Social History   Tobacco Use  . Smoking status: Never Smoker  . Smokeless tobacco: Never Used  Substance Use Topics  . Alcohol use: No  . Drug use: No     Allergies   Amoxicillin   Review of Systems Review of Systems Ten systems reviewed and are negative for acute change, except as noted in the HPI.    Physical Exam Updated Vital Signs BP 108/64 (BP Location: Left Arm)   Pulse (!) 106   Temp 98.1 F (36.7 C) (Oral)   Resp 18   LMP 02/19/2018   SpO2 97%   Physical Exam  Constitutional: She is oriented to person, place, and time. She appears well-developed and well-nourished. No distress.  Nontoxic appearing and in no distress  HENT:  Head: Normocephalic and atraumatic.  Eyes: Conjunctivae and EOM are  normal. No scleral icterus.  Neck: Normal range of motion.  Cardiovascular: Normal rate, regular rhythm and intact distal pulses.  Distal radial pulse 2+ in the right upper extremity  Pulmonary/Chest: Effort normal. No respiratory distress.  Respirations even and unlabored  Musculoskeletal: Normal range of motion.       Right hand: She exhibits tenderness and laceration. She exhibits normal range of motion and no swelling. Normal sensation noted.       Hands: Neurological: She is alert and oriented to person, place, and time. She exhibits normal muscle tone. Coordination normal.  Sensation to light touch intact.  Finger-to-thumb opposition intact in the right hand.  Skin: Skin is warm and dry. No rash noted. She is not diaphoretic. No erythema. No pallor.  Psychiatric: She has a normal mood and affect. Her behavior is normal.  Nursing note and vitals reviewed.    ED Treatments / Results  Labs (all labs ordered are listed, but only abnormal results are displayed) Labs Reviewed - No data to display  EKG None  Radiology No results found.  Procedures Procedures (including critical care time)  LACERATION REPAIR Performed by: Antonietta Breach Authorized by: Antonietta Breach Consent: Verbal consent obtained. Risks and benefits: risks, benefits and alternatives were discussed Consent given by: patient Patient identity confirmed: provided demographic data Prepped and Draped in normal sterile fashion Wound explored  Laceration Location: R palm  Laceration Length: 5cm  No Foreign Bodies seen or palpated  Anesthesia: local infiltration  Local anesthetic: lidocaine 1% with epinephrine  Anesthetic total: 4 ml  Irrigation method: syringe Amount of cleaning: standard  Skin closure: 4-0 ethilon  Number of sutures: 3  Technique: horizontal mattress  Patient tolerance: Patient tolerated the procedure well with no immediate complications.   Medications Ordered in ED Medications    lidocaine-EPINEPHrine (XYLOCAINE W/EPI) 1 %-1:100000 (with pres) injection 10 mL (has no administration in time range)  Tdap (BOOSTRIX) injection 0.5 mL (0.5 mLs Intramuscular Given 03/11/18 0533)     Initial Impression / Assessment and Plan / ED Course  I have reviewed the triage vital signs and the nursing notes.  Pertinent labs & imaging results that were available during my care of the patient were reviewed by me and considered in my medical decision making (see chart for details).     Tdap booster given. Laceration occurred < 8 hours prior to repair which was well tolerated. Discussed suture home care with pt and answered questions. Patient to follow up for wound check and suture removal in 14 days. Patient is hemodynamically stable with no complaints prior to discharge.  Final Clinical Impressions(s) / ED Diagnoses   Final diagnoses:  Laceration of right hand, foreign body presence unspecified, initial encounter    ED Discharge Orders         Ordered    cephALEXin (KEFLEX) 500 MG capsule  4 times daily,   Status:  Discontinued     03/11/18 0534    cephALEXin (KEFLEX) 500 MG capsule  4 times daily     03/11/18 0535           Antonietta Breach, PA-C 03/11/18 Marrero, [place], MD 03/11/18 [phone]

## 2018-03-11 NOTE — ED Notes (Signed)
Dressed Pt's wound and went over discharge instructions. Pt verbalized understanding.

## 2018-04-08 ENCOUNTER — Other Ambulatory Visit: Payer: Self-pay | Admitting: Endocrinology

## 2018-04-08 NOTE — Telephone Encounter (Signed)
Please refill x 1 Ov is due  

## 2018-04-13 ENCOUNTER — Other Ambulatory Visit: Payer: Self-pay

## 2018-04-13 MED ORDER — INSULIN ASPART 100 UNIT/ML FLEXPEN: PEN_INJECTOR | SUBCUTANEOUS | 0 refills | Status: DC

## 2018-05-15 ENCOUNTER — Ambulatory Visit (INDEPENDENT_AMBULATORY_CARE_PROVIDER_SITE_OTHER): Payer: BLUE CROSS/BLUE SHIELD | Admitting: Endocrinology

## 2018-05-15 ENCOUNTER — Encounter: Payer: Self-pay | Admitting: Endocrinology

## 2018-05-15 VITALS — BP 110/60 | HR 139 | Ht 65.0 in | Wt 164.2 lb

## 2018-05-15 DIAGNOSIS — E109 Type 1 diabetes mellitus without complications: Secondary | ICD-10-CM | POA: Diagnosis not present

## 2018-05-15 DIAGNOSIS — Z23 Encounter for immunization: Secondary | ICD-10-CM | POA: Diagnosis not present

## 2018-05-15 LAB — POCT GLYCOSYLATED HEMOGLOBIN (HGB A1C): Hemoglobin A1C: 8.6 % — AB (ref 4.0–5.6)

## 2018-05-15 MED ORDER — BASAGLAR KWIKPEN 100 UNIT/ML ~~LOC~~ SOPN: 26.0000 [IU] | PEN_INJECTOR | Freq: Every day | SUBCUTANEOUS | 11 refills | Status: DC

## 2018-05-15 MED ORDER — INSULIN ASPART 100 UNIT/ML FLEXPEN: PEN_INJECTOR | SUBCUTANEOUS | 11 refills | Status: DC

## 2018-05-15 NOTE — Progress Notes (Signed)
Subjective:    Patient ID: Casey Wolf, female    DOB: Jul 04, 1996, 22 y.o.   MRN: [id number]  HPI Pt returns for f/u of diabetes mellitus: DM type: Insulin-requiring type 2 Dx'ed: 3267 Complications: none Therapy: insulin since dx GDM: never (G0) DKA: never Severe hypoglycemia: never Pancreatitis: never Pancreatic imaging: never Other: she declines pump and continuous glucose monitor, at least for now; she takes multiple daily injections Interval history: Pt says she misses 3-4 doses per week.  no cbg record, but states cbg's are mildly low 2-3 times per week.  This usually happens in the middle of the night.  She thinks this is due to taking too much novolog for HS-snack.  She averages a total of 40-50 units of novolog per day.  She says highest cbg is fasting.  Past Medical History:  Diagnosis Date  . Anxiety   . Depression   . Diabetes mellitus without complication ([place])   . HPV in female   . Thyroid disease     Past Surgical History:  Procedure Laterality Date  . INTRAUTERINE DEVICE INSERTION     kyleena inserted 01-21-16?    Social History   Socioeconomic History  . Marital status: Single    Spouse name: Not on file  . Number of children: Not on file  . Years of education: Not on file  . Highest education level: Not on file  Occupational History  . Not on file  Social Needs  . Financial resource strain: Not on file  . Food insecurity:    Worry: Not on file    Inability: Not on file  . Transportation needs:    Medical: Not on file    Non-medical: Not on file  Tobacco Use  . Smoking status: Never Smoker  . Smokeless tobacco: Never Used  Substance and Sexual Activity  . Alcohol use: No  . Drug use: No  . Sexual activity: Yes    Partners: Female, Female    Birth control/protection: IUD  Lifestyle  . Physical activity:    Days per week: Not on file    Minutes per session: Not on file  . Stress: Not on file  Relationships  . Social connections:   Talks on phone: Not on file    Gets together: Not on file    Attends religious service: Not on file    Active member of club or organization: Not on file    Attends meetings of clubs or organizations: Not on file    Relationship status: Not on file  . Intimate partner violence:    Fear of current or ex partner: Not on file    Emotionally abused: Not on file    Physically abused: Not on file    Forced sexual activity: Not on file  Other Topics Concern  . Not on file  Social History Narrative  . Not on file    Current Outpatient Medications on File Prior to Visit  Medication Sig Dispense Refill  . Blood Glucose Monitoring Suppl (ACCU-CHEK GUIDE) w/Device KIT 1 Device by Does not apply route daily as needed. 1 kit 0  . buPROPion (WELLBUTRIN XL) 150 MG 24 hr tablet Take 150 mg by mouth daily.    . cephALEXin (KEFLEX) 500 MG capsule Take 1 capsule (500 mg total) by mouth 4 (four) times daily. Take as prescribed until finished for infection prevention. 20 capsule 0  . glucose blood (ACCU-CHEK GUIDE) test strip Used to check blood sugars four times daily. [place]  each 12  . Lancets (ACCU-CHEK MULTICLIX) lancets Used to check blood sugars four times daily. 100 each 12  . Levonorgestrel (KYLEENA) 19.5 MG IUD 1 each by Intrauterine route daily.     . metroNIDAZOLE (FLAGYL) 500 MG tablet Take 1 tablet (500 mg total) by mouth 2 (two) times daily. 14 tablet 0   No current facility-administered medications on file prior to visit.     Allergies  Allergen Reactions  . Amoxicillin Hives    Family History  Problem Relation Age of Onset  . Thyroid disease Father   . Diabetes Cousin     BP 110/60 (BP Location: Left Arm, Patient Position: Sitting, Cuff Size: Normal)   Pulse (!) 139   Ht 5' 5"  (1.651 m)   Wt 164 lb 3.2 oz (74.5 kg)   SpO2 95%   BMI 27.32 kg/m   Review of Systems Denies LOC.      Objective:   Physical Exam VITAL SIGNS:  See vs page GENERAL: no distress Pulses: foot  pulses are intact bilaterally.   MSK: no deformity of the feet or ankles.  CV: no edema of the legs or ankles Skin:  no ulcer on the feet or ankles.  normal color and temp on the feet and ankles Neuro: sensation is intact to touch on the feet and ankles.      Lab Results  Component Value Date   HGBA1C 8.6 (A) 05/15/2018       Assessment & Plan:  Hypothyroidism: recheck today.  Insulin-requiring type 2 DM: she needs increased rx.  Hypoglycemia: this limits aggressiveness of glycemic control  Patient Instructions  check your blood sugar 4 times a day: before the 3 meals, and at bedtime.  also check if you have symptoms of your blood sugar being too high or too low.  please keep a record of the readings and bring it to your next appointment here (or you can bring the meter itself).  You can write it on any piece of paper.  please call us sooner if your blood sugar goes below 70, or if you have a lot of readings over 200. In view of your medical condition, you should avoid pregnancy until we have decided it is safe.   For now, please increase the basaglar to 26 units at bedtime, and:  Take novolog, 1 unit/4 grams at breakfast and supper.  Take 1 unit/6 grams at lunch, and if you eat after supper.   Please come back for a follow-up appointment in 2 months.

## 2018-05-15 NOTE — Patient Instructions (Addendum)
check your blood sugar 4 times a day: before the 3 meals, and at bedtime.  also check if you have symptoms of your blood sugar being too high or too low.  please keep a record of the readings and bring it to your next appointment here (or you can bring the meter itself).  You can write it on any piece of paper.  please call us sooner if your blood sugar goes below 70, or if you have a lot of readings over 200. In view of your medical condition, you should avoid pregnancy until we have decided it is safe.   For now, please increase the basaglar to 26 units at bedtime, and:  Take novolog, 1 unit/4 grams at breakfast and supper.  Take 1 unit/6 grams at lunch, and if you eat after supper.   Please come back for a follow-up appointment in 2 months.

## 2018-05-16 LAB — BASIC METABOLIC PANEL
BUN: 11 mg/dL (ref 6–23)
CO2: 24 meq/L (ref 19–32)
Calcium: 10.4 mg/dL (ref 8.4–10.5)
Chloride: 98 mEq/L (ref 96–112)
Creatinine, Ser: 0.66 mg/dL (ref 0.40–1.20)
GFR: 120.99 mL/min (ref >60.00)
Glucose, Bld: 179 mg/dL — ABNORMAL HIGH (ref 70–99)
POTASSIUM: 4.3 meq/L (ref 3.5–5.1)
SODIUM: 136 meq/L (ref 135–145)

## 2018-05-16 LAB — T4, FREE: Free T4: 1.21 ng/dL (ref 0.60–1.60)

## 2018-05-16 LAB — TSH: TSH: 0.02 u[IU]/mL — ABNORMAL LOW (ref 0.35–5.50)

## 2018-05-16 MED ORDER — LEVOTHYROXINE SODIUM 150 MCG PO TABS: 150.0000 ug | ORAL_TABLET | Freq: Every day | ORAL | 3 refills | Status: DC

## 2018-05-18 ENCOUNTER — Telehealth: Payer: Self-pay

## 2018-05-18 NOTE — Telephone Encounter (Signed)
Unable to reach pt to inform about lab results and new prescription. Letter mailed

## 2018-05-19 ENCOUNTER — Other Ambulatory Visit: Payer: Self-pay | Admitting: Endocrinology

## 2018-07-18 ENCOUNTER — Telehealth: Payer: Self-pay | Admitting: Endocrinology

## 2018-07-18 ENCOUNTER — Ambulatory Visit: Payer: BLUE CROSS/BLUE SHIELD | Admitting: Endocrinology

## 2018-07-18 DIAGNOSIS — Z0289 Encounter for other administrative examinations: Secondary | ICD-10-CM

## 2018-07-18 NOTE — Telephone Encounter (Signed)
Patient no showed today's appt. Please advise on how to follow up. °A. No follow up necessary. °B. Follow up urgent. Contact patient immediately. °C. Follow up necessary. Contact patient and schedule visit in ___ days. °D. Follow up advised. Contact patient and schedule visit in ____weeks. ° °Would you like the NS fee to be applied to this visit? ° °

## 2018-07-18 NOTE — Telephone Encounter (Signed)
Please advise ov next available 

## 2018-07-19 NOTE — Telephone Encounter (Signed)
Please reschedule pt.

## 2018-07-19 NOTE — Telephone Encounter (Signed)
LMTCB to reschedule missed appointment °

## 2018-09-15 ENCOUNTER — Telehealth: Payer: Self-pay | Admitting: Endocrinology

## 2018-09-15 ENCOUNTER — Other Ambulatory Visit: Payer: Self-pay

## 2018-09-15 DIAGNOSIS — E109 Type 1 diabetes mellitus without complications: Secondary | ICD-10-CM

## 2018-09-15 MED ORDER — PEN NEEDLES 30G X 8 MM MISC: 1.0000 | Freq: Every day | 0 refills | Status: DC

## 2018-09-15 MED ORDER — GLUCOSE BLOOD VI STRP: ORAL_STRIP | 12 refills | Status: DC

## 2018-09-15 MED ORDER — "PEN NEEDLES 5/16"" 31G X 8 MM MISC": 1.0000 | Freq: Every day | 1 refills | Status: DC

## 2018-09-15 NOTE — Telephone Encounter (Signed)
Patient is needing a prescription sent in for an extra  Test kit, NANO needles for her Novolog pen And test strips for the ACCU CHECK GUIDE     [id number] [place] [place] [address]

## 2018-09-15 NOTE — Telephone Encounter (Signed)
E-Prescribing Status: Receipt confirmed by pharmacy (09/15/2018 1:09 PM EDT)  30 day supply has been sent to her pharmacy. Pt will require an appt before any future refill requests can be authorized.

## 2018-09-22 ENCOUNTER — Telehealth: Payer: Self-pay | Admitting: Endocrinology

## 2018-09-22 NOTE — Telephone Encounter (Signed)
Patient called [facility] 09/21/2018 @ 5:25  Stated that her prescription for Test strips and Synthroid 150mg  were not at the pharmacy   [facility] sent in medication for patient , needs MD signature on paperwork. This has been placed in the provider's basket up front.

## 2018-09-22 NOTE — Telephone Encounter (Signed)
please contact patient: Ov is due 

## 2018-09-25 NOTE — Telephone Encounter (Signed)
LMTCB to reschedule missed appointment °

## 2018-09-25 NOTE — Telephone Encounter (Signed)
Could you please schedule this pt? 

## 2018-12-14 ENCOUNTER — Other Ambulatory Visit: Payer: Self-pay | Admitting: Endocrinology

## 2018-12-15 ENCOUNTER — Telehealth: Payer: Self-pay

## 2018-12-15 NOTE — Telephone Encounter (Signed)
Patient scheduled a DOXY visit with Dr. Loanne Drilling on July 1st due to being quarantened in [place].   For refill request of thyroid medication she stated to make sure it was Synthroid because the Levothyroxine does not work for her. [id number] [place] [place]  Please Advise, Thanks

## 2018-12-15 NOTE — Telephone Encounter (Signed)
LOV 05/15/18. Per Dr. Loanne Drilling, f/u in 2 mo. Called pt to schedule appt. LVM requesting returned call.

## 2019-01-03 ENCOUNTER — Ambulatory Visit: Payer: BLUE CROSS/BLUE SHIELD | Admitting: Endocrinology

## 2019-01-03 DIAGNOSIS — Z0289 Encounter for other administrative examinations: Secondary | ICD-10-CM

## 2019-02-01 ENCOUNTER — Telehealth: Payer: Self-pay | Admitting: Endocrinology

## 2019-02-01 ENCOUNTER — Other Ambulatory Visit: Payer: Self-pay | Admitting: Endocrinology

## 2019-02-01 ENCOUNTER — Other Ambulatory Visit: Payer: Self-pay

## 2019-02-01 ENCOUNTER — Encounter: Payer: Self-pay | Admitting: Endocrinology

## 2019-02-01 ENCOUNTER — Ambulatory Visit (INDEPENDENT_AMBULATORY_CARE_PROVIDER_SITE_OTHER): Payer: BC Managed Care – PPO | Admitting: Endocrinology

## 2019-02-01 VITALS — Ht 65.0 in | Wt 157.0 lb

## 2019-02-01 DIAGNOSIS — E039 Hypothyroidism, unspecified: Secondary | ICD-10-CM | POA: Diagnosis not present

## 2019-02-01 DIAGNOSIS — E109 Type 1 diabetes mellitus without complications: Secondary | ICD-10-CM

## 2019-02-01 NOTE — Telephone Encounter (Signed)
Please refer to Dr. Cordelia Pen request. He provided the following phone # for [facility] in [place] as [phone]

## 2019-02-01 NOTE — Progress Notes (Signed)
Subjective:    Patient ID: Casey Wolf, female    DOB: 1995-11-08, 23 y.o.   MRN: [id number]  HPI  telehealth visit today via doxy video visit.  Alternatives to telehealth are presented to this patient, and the patient agrees to the telehealth visit. Pt is advised of the cost of the visit, and agrees to this, also.   Patient is at home, and I am at the office.   Persons attending the telehealth visit: the patient and I.  Pt returns for f/u of diabetes mellitus: DM type: Insulin-requiring type 2 Dx'ed: 4098 Complications: none Therapy: insulin since dx GDM: never (G0) DKA: never Severe hypoglycemia: never.  Pancreatitis: never Pancreatic imaging: never Other: she declines pump and continuous glucose monitor, at least for now; she takes multiple daily injections Interval history: She seldom has hypoglycemia, and these episodes are mild.  She says cbg varies from 50-350.  It is in general lowest at HS, and highest fasting (due to treating the HS low).  Pt says she misses approx 3 insulin doses per week.  She takes basaglar 26 units qhs, and novolog 1 unit/4 grams at all meals (averages approx 15 units per meal).  She is in [place] now, but will returns to [place] soon.   Past Medical History:  Diagnosis Date  . Anxiety   . Depression   . Diabetes mellitus without complication ([place])   . HPV in female   . Thyroid disease     Past Surgical History:  Procedure Laterality Date  . INTRAUTERINE DEVICE INSERTION     kyleena inserted 01-21-16?    Social History   Socioeconomic History  . Marital status: Single    Spouse name: Not on file  . Number of children: Not on file  . Years of education: Not on file  . Highest education level: Not on file  Occupational History  . Not on file  Social Needs  . Financial resource strain: Not on file  . Food insecurity    Worry: Not on file    Inability: Not on file  . Transportation needs    Medical: Not on file    Non-medical: Not on file   Tobacco Use  . Smoking status: Never Smoker  . Smokeless tobacco: Never Used  Substance and Sexual Activity  . Alcohol use: No  . Drug use: No  . Sexual activity: Yes    Partners: Female, Female    Birth control/protection: I.U.D.  Lifestyle  . Physical activity    Days per week: Not on file    Minutes per session: Not on file  . Stress: Not on file  Relationships  . Social Herbalist on phone: Not on file    Gets together: Not on file    Attends religious service: Not on file    Active member of club or organization: Not on file    Attends meetings of clubs or organizations: Not on file    Relationship status: Not on file  . Intimate partner violence    Fear of current or ex partner: Not on file    Emotionally abused: Not on file    Physically abused: Not on file    Forced sexual activity: Not on file  Other Topics Concern  . Not on file  Social History Narrative  . Not on file    Current Outpatient Medications on File Prior to Visit  Medication Sig Dispense Refill  . Blood Glucose Monitoring Suppl (ACCU-CHEK GUIDE)  w/Device KIT 1 Device by Does not apply route daily as needed. (Patient taking differently: 1 Device by Does not apply route 6 (six) times daily. ) 1 kit 0  . insulin aspart (NOVOLOG FLEXPEN) 100 UNIT/ML FlexPen Per carbohydrate ratio, for a total of 70 units/day, and pen needles 5/day 30 mL 11  . Insulin Glargine (BASAGLAR KWIKPEN) 100 UNIT/ML SOPN Inject 0.26 mLs (26 Units total) into the skin daily. 5 pen 11  . Insulin Pen Needle (PEN NEEDLES 31GX5/16") 31G X 8 MM MISC 1 each by Does not apply route 5 (five) times daily. Use to inject insulin 5 times daily 1000 each 1  . Lancets (ACCU-CHEK MULTICLIX) lancets Used to check blood sugars four times daily. (Patient taking differently: 1 each by Other route 6 (six) times daily. Used to check blood sugars four times daily.) 100 each 12  . Levonorgestrel (KYLEENA) 19.5 MG IUD 1 each by Intrauterine route  daily.     Marland Kitchen levothyroxine (SYNTHROID, LEVOTHROID) 150 MCG tablet Take 1 tablet (150 mcg total) by mouth daily before breakfast. 90 tablet 3  . lisdexamfetamine (VYVANSE) 20 MG capsule Take 20 mg by mouth daily as needed.     No current facility-administered medications on file prior to visit.     Allergies  Allergen Reactions  . Amoxicillin Hives    Family History  Problem Relation Age of Onset  . Thyroid disease Father   . Diabetes Cousin     Ht _0  (1.651 m)   Wt 157 lb (71.2 kg)   BMI 26.13 kg/m    Review of Systems Denies LOC.     Objective:   Physical Exam       Assessment & Plan:  Insulin-requiring type 2 DM: uncertain glycemic control. Hypoglycemia: this limits aggressiveness of glycemic control. Hypothyroidism: recheck today.  Patient Instructions  check your blood sugar 6 times a day: before the 3 meals, and at bedtime.  also check if you have symptoms of your blood sugar being too high or too low.  please keep a record of the readings and bring it to your next appointment here (or you can bring the meter itself).  You can write it on any piece of paper.  please call us sooner if your blood sugar goes below 70, or if you have a lot of readings over 200. In view of your medical condition, you should avoid pregnancy until we have decided it is safe.   For now, please increase the basaglar to 26 units at bedtime, and:  Take novolog, 1 unit/4 grams at breakfast and supper.  Take Please come back for a follow-up appointment in 2 months.

## 2019-02-01 NOTE — Patient Instructions (Addendum)
check your blood sugar 6 times a day: before the 3 meals, and at bedtime.  also check if you have symptoms of your blood sugar being too high or too low.  please keep a record of the readings and bring it to your next appointment here (or you can bring the meter itself).  You can write it on any piece of paper.  please call us sooner if your blood sugar goes below 70, or if you have a lot of readings over 200. In view of your medical condition, you should avoid pregnancy until we have decided it is safe.   For now, please increase the basaglar to 26 units at bedtime, and:  Take novolog, 1 unit/4 grams at breakfast and supper.  Take Please come back for a follow-up appointment in 2 months.

## 2019-02-01 NOTE — Telephone Encounter (Signed)
I ordered labs to be done at [facility] (near [address]).  Please fax there, and advise pt I have ordered.

## 2019-02-02 ENCOUNTER — Telehealth: Payer: Self-pay

## 2019-02-02 NOTE — Telephone Encounter (Signed)
Dr. Loanne Drilling addressed request from pt to have COVID and antibody testing added to her labs. He states, "our office does note offer this test". Although pt is having labs drawn in [place], would still need orders from her PCP as this is not something Dr. Loanne Drilling would order.

## 2019-02-05 NOTE — Telephone Encounter (Signed)
The best I can do is the letter I did last week.

## 2019-02-05 NOTE — Telephone Encounter (Signed)
Pt is asking now for the letter from [place] to be sent to her school regarding her diabetes and asking for off campus clearance due to her condition and COVID Fax to [phone] accessibility at [employer]

## 2019-02-05 NOTE — Telephone Encounter (Signed)
Chart Review Routing History Since 02/06/2018 (View Full Routing History)  Recipients Sent On Sent By Routed Reports   Accessibility at [facility]     02/05/2019 4:38 PM Kenyia Wambolt, LPN Letter on 6/70/1100 from Renato Shin, MD     Cover Page Message : This student has given verbal consent for this letter to be faxed to you for your review.

## 2019-02-05 NOTE — Telephone Encounter (Signed)
Please advise if you are willing to write this letter

## 2019-04-06 ENCOUNTER — Other Ambulatory Visit: Payer: Self-pay

## 2019-04-06 ENCOUNTER — Telehealth: Payer: Self-pay | Admitting: Endocrinology

## 2019-04-06 DIAGNOSIS — E109 Type 1 diabetes mellitus without complications: Secondary | ICD-10-CM

## 2019-04-06 MED ORDER — NOVOLOG FLEXPEN 100 UNIT/ML ~~LOC~~ SOPN: PEN_INJECTOR | SUBCUTANEOUS | 0 refills | Status: DC

## 2019-04-06 MED ORDER — "PEN NEEDLES 5/16"" 31G X 8 MM MISC": 1.0000 | Freq: Every day | 1 refills | Status: DC

## 2019-04-06 NOTE — Telephone Encounter (Signed)
E-Prescribing Status: Receipt confirmed by pharmacy (04/06/2019 3:12 PM EDT)

## 2019-04-06 NOTE — Telephone Encounter (Signed)
MEDICATION: Novolog Pen  [facility] [address] [facility] [place] [facility]   IS PATIENT OUT OF MEDICATION: yes - left her at home, see note below  IF NOT; HOW MUCH IS LEFT:   LAST APPOINTMENT DATE: @7 /31/2020  NEXT APPOINTMENT DATE:@Visit  date not found  DO WE HAVE YOUR PERMISSION TO LEAVE A DETAILED MESSAGE: yes, [phone]  OTHER COMMENTS:  Temporary refill Patient is in [place] for two weeks and left her Novoloag at home   **Let patient know to contact pharmacy at the end of the day to make sure medication is ready. **  ** Please notify patient to allow 48-72 hours to process**  **Encourage patient to contact the pharmacy for refills or they can request refills through [facility]**

## 2019-06-12 ENCOUNTER — Other Ambulatory Visit: Payer: Self-pay

## 2019-06-12 ENCOUNTER — Telehealth: Payer: Self-pay

## 2019-06-12 DIAGNOSIS — E109 Type 1 diabetes mellitus without complications: Secondary | ICD-10-CM

## 2019-06-12 MED ORDER — ACCU-CHEK GUIDE VI STRP: 1.0000 | ORAL_STRIP | Freq: Every day | 0 refills | Status: DC

## 2019-06-12 MED ORDER — LEVOTHYROXINE SODIUM 150 MCG PO TABS: 150.0000 ug | ORAL_TABLET | Freq: Every day | ORAL | 0 refills | Status: DC

## 2019-06-12 NOTE — Telephone Encounter (Signed)
MEDICATION: Blood Glucose Monitoring Suppl (ACCU-CHEK GUIDE) w/Device KIT  levothyroxine (SYNTHROID, LEVOTHROID) 150 MCG tablet  [facility]  [id number] [place] [place] - [address]  IS THIS A 90 DAY SUPPLY : yes  IS PATIENT OUT OF MEDICATION:   IF NOT; HOW MUCH IS LEFT:   LAST APPOINTMENT DATE: @10 /08/2018  NEXT APPOINTMENT DATE:@Visit  date not found  DO WE HAVE YOUR PERMISSION TO LEAVE A DETAILED MESSAGE:  OTHER COMMENTS:    **Let patient know to contact pharmacy at the end of the day to make sure medication is ready. **  ** Please notify patient to allow 48-72 hours to process**  **Encourage patient to contact the pharmacy for refills or they can request refills through [facility]*

## 2019-06-12 NOTE — Telephone Encounter (Signed)
Per Dr. Loanne Drilling, only able to provide 30 day supply of Levothyroxine and test strips d/t needing an appt. Routing this message to the front desk for scheduling purposes.

## 2019-06-12 NOTE — Telephone Encounter (Signed)
Received fax from pharmacy re: this refill request. Placed documents on Dr. Cordelia Pen desk for him to review and address. Refill is pending Dr. Cordelia Pen response.

## 2019-06-14 NOTE — Telephone Encounter (Signed)
Called pt and left detailed voicemail with MD message and request to call back and schedule a f/u.

## 2019-06-20 ENCOUNTER — Other Ambulatory Visit: Payer: Self-pay | Admitting: Endocrinology

## 2019-06-25 ENCOUNTER — Telehealth: Payer: Self-pay | Admitting: Endocrinology

## 2019-06-25 ENCOUNTER — Other Ambulatory Visit: Payer: Self-pay

## 2019-06-25 DIAGNOSIS — E109 Type 1 diabetes mellitus without complications: Secondary | ICD-10-CM

## 2019-06-25 MED ORDER — BASAGLAR KWIKPEN 100 UNIT/ML ~~LOC~~ SOPN: 26.0000 [IU] | PEN_INJECTOR | Freq: Every day | SUBCUTANEOUS | 0 refills | Status: DC

## 2019-06-25 MED ORDER — NOVOLOG FLEXPEN 100 UNIT/ML ~~LOC~~ SOPN: PEN_INJECTOR | SUBCUTANEOUS | 0 refills | Status: DC

## 2019-06-25 NOTE — Telephone Encounter (Signed)
E-Prescribing Status: Receipt confirmed by pharmacy (06/25/2019  3:24 PM EST)

## 2019-06-25 NOTE — Telephone Encounter (Signed)
MEDICATION: insulin aspart (NOVOLOG FLEXPEN) 100 UNIT/ML FlexPen   AND Insulin Glargine (BASAGLAR KWIKPEN) 100 UNIT/ML SOPN  PHARMACY:  [id number] [place], [place] - [address]  IS THIS A 90 DAY SUPPLY : Yes  IS PATIENT OUT OF MEDICATION: Almost  IF NOT; HOW MUCH IS LEFT: Maybe 2 days  LAST APPOINTMENT DATE: @12 /16/2020  NEXT APPOINTMENT DATE:@1 /10/2019  DO WE HAVE YOUR PERMISSION TO LEAVE A DETAILED MESSAGE: Yes  OTHER COMMENTS:    **Let patient know to contact pharmacy at the end of the day to make sure medication is ready. **  ** Please notify patient to allow 48-72 hours to process**  **Encourage patient to contact the pharmacy for refills or they can request refills through [facility]**

## 2019-06-26 ENCOUNTER — Other Ambulatory Visit: Payer: Self-pay | Admitting: Endocrinology

## 2019-06-26 DIAGNOSIS — E109 Type 1 diabetes mellitus without complications: Secondary | ICD-10-CM

## 2019-07-08 ENCOUNTER — Other Ambulatory Visit: Payer: Self-pay | Admitting: Endocrinology

## 2019-07-08 NOTE — Telephone Encounter (Signed)
1.  Please schedule f/u appt 2.  Then please refill x 1, pending that appt.  

## 2019-07-09 ENCOUNTER — Other Ambulatory Visit: Payer: Self-pay

## 2019-07-09 ENCOUNTER — Other Ambulatory Visit: Payer: Self-pay | Admitting: Endocrinology

## 2019-07-09 ENCOUNTER — Ambulatory Visit (INDEPENDENT_AMBULATORY_CARE_PROVIDER_SITE_OTHER): Payer: BC Managed Care – PPO | Admitting: Endocrinology

## 2019-07-09 ENCOUNTER — Encounter: Payer: Self-pay | Admitting: Endocrinology

## 2019-07-09 DIAGNOSIS — E109 Type 1 diabetes mellitus without complications: Secondary | ICD-10-CM

## 2019-07-09 NOTE — Patient Instructions (Addendum)
check your blood sugar 6 times a day: before the 3 meals, and at bedtime.  also check if you have symptoms of your blood sugar being too high or too low.  please keep a record of the readings and bring it to your next appointment here (or you can bring the meter itself).  You can write it on any piece of paper.  please call us sooner if your blood sugar goes below 70, or if you have a lot of readings over 200.   For now, Please continue the same basaglar at bedtime, and:  Take novolog, 1 unit/4 grams with meals.  However, take 2 extra units with breakfast.  Also, subtract 1 unit if you are going to be active.   Please come back for a follow-up appointment in 2 months.

## 2019-07-09 NOTE — Progress Notes (Signed)
Subjective:    Patient ID: Casey Wolf, female    DOB: 1996-01-29, 24 y.o.   MRN: [id number]  HPI telehealth visit today via doxy video visit.  Alternatives to telehealth are presented to this patient, and the patient agrees to the telehealth visit. Pt is advised of the cost of the visit, and agrees to this, also.   Patient is at home, and I am at the office.   Persons attending the telehealth visit: the patient and I Pt returns for f/u of diabetes mellitus: DM type: Insulin-requiring type 2 Dx'ed: 7681 Complications: none Therapy: insulin since dx GDM: never (G0) DKA: never Severe hypoglycemia: never.  Pancreatitis: never Pancreatic imaging: never.   Other: she declines pump and continuous glucose monitor, at least for now; she takes multiple daily injections.   Interval history: She seldom has hypoglycemia, and these episodes are mild.   This happens with exercise.  Pt says she has not recently missed any insulin doses.  She says cbg varies from 65-300.  It is in general highest at lunch.   Past Medical History:  Diagnosis Date  . Anxiety   . Depression   . Diabetes mellitus without complication ([place])   . HPV in female   . Thyroid disease     Past Surgical History:  Procedure Laterality Date  . INTRAUTERINE DEVICE INSERTION     kyleena inserted 01-21-16?    Social History   Socioeconomic History  . Marital status: Single    Spouse name: Not on file  . Number of children: Not on file  . Years of education: Not on file  . Highest education level: Not on file  Occupational History  . Not on file  Tobacco Use  . Smoking status: Never Smoker  . Smokeless tobacco: Never Used  Substance and Sexual Activity  . Alcohol use: No  . Drug use: No  . Sexual activity: Yes    Partners: Female, Female    Birth control/protection: I.U.D.  Other Topics Concern  . Not on file  Social History Narrative  . Not on file   Social Determinants of Health   Financial Resource  Strain:   . Difficulty of Paying Living Expenses: Not on file  Food Insecurity:   . Worried About Charity fundraiser in the Last Year: Not on file  . Ran Out of Food in the Last Year: Not on file  Transportation Needs:   . Lack of Transportation (Medical): Not on file  . Lack of Transportation (Non-Medical): Not on file  Physical Activity:   . Days of Exercise per Week: Not on file  . Minutes of Exercise per Session: Not on file  Stress:   . Feeling of Stress : Not on file  Social Connections:   . Frequency of Communication with Friends and Family: Not on file  . Frequency of Social Gatherings with Friends and Family: Not on file  . Attends Religious Services: Not on file  . Active Member of Clubs or Organizations: Not on file  . Attends Archivist Meetings: Not on file  . Marital Status: Not on file  Intimate Partner Violence:   . Fear of Current or Ex-Partner: Not on file  . Emotionally Abused: Not on file  . Physically Abused: Not on file  . Sexually Abused: Not on file    Current Outpatient Medications on File Prior to Visit  Medication Sig Dispense Refill  . Blood Glucose Monitoring Suppl (ACCU-CHEK GUIDE) w/Device KIT 1 Device  by Does not apply route daily as needed. (Patient taking differently: 1 Device by Does not apply route 6 (six) times daily. ) 1 kit 0  . insulin aspart (NOVOLOG FLEXPEN) 100 UNIT/ML FlexPen Per carbohydrate ratio, for a total of 70 units/day 21 mL 0  . Insulin Glargine (BASAGLAR KWIKPEN) 100 UNIT/ML SOPN Inject 0.26 mLs (26 Units total) into the skin daily. 8 mL 0  . Insulin Pen Needle (PEN NEEDLES 31GX5/16") 31G X 8 MM MISC 1 each by Does not apply route daily. Use to inject insulin 5 times daily 30 each 1  . Lancets (ACCU-CHEK MULTICLIX) lancets Used to check blood sugars four times daily. (Patient taking differently: 1 each by Other route 6 (six) times daily. Used to check blood sugars four times daily.) 100 each 12  . Levonorgestrel  (KYLEENA) 19.5 MG IUD 1 each by Intrauterine route daily.     Marland Kitchen lisdexamfetamine (VYVANSE) 20 MG capsule Take 20 mg by mouth daily as needed.    Marland Kitchen SYNTHROID 150 MCG tablet TAKE 1 TABLET (150 MCG TOTAL) BY MOUTH DAILY BEFORE BREAKFAST. NEED APPT 30 tablet 0   No current facility-administered medications on file prior to visit.    Allergies  Allergen Reactions  . Amoxicillin Hives    Family History  Problem Relation Age of Onset  . Thyroid disease Father   . Diabetes Cousin     There were no vitals taken for this visit.   Review of Systems Denies LOC.      Objective:   Physical Exam    outside test results are reviewed: A1c=8.3%    Assessment & Plan:  Type 1 DM: she needs increased rx Hypoglycemia: this limits aggressiveness of glycemic control   Patient Instructions  check your blood sugar 6 times a day: before the 3 meals, and at bedtime.  also check if you have symptoms of your blood sugar being too high or too low.  please keep a record of the readings and bring it to your next appointment here (or you can bring the meter itself).  You can write it on any piece of paper.  please call us sooner if your blood sugar goes below 70, or if you have a lot of readings over 200.   For now, Please continue the same basaglar at bedtime, and:  Take novolog, 1 unit/4 grams with meals.  However, take 2 extra units with breakfast.  Also, subtract 1 unit if you are going to be active.   Please come back for a follow-up appointment in 2 months.

## 2019-08-15 ENCOUNTER — Other Ambulatory Visit: Payer: Self-pay | Admitting: Endocrinology

## 2019-08-15 NOTE — Telephone Encounter (Signed)
Please advise. Does not appear labs have been completed since 2019

## 2019-08-16 ENCOUNTER — Other Ambulatory Visit: Payer: Self-pay | Admitting: Endocrinology

## 2019-08-16 DIAGNOSIS — E109 Type 1 diabetes mellitus without complications: Secondary | ICD-10-CM

## 2019-08-28 ENCOUNTER — Other Ambulatory Visit: Payer: Self-pay | Admitting: Endocrinology

## 2019-08-29 NOTE — Telephone Encounter (Signed)
Last ordered: 2 weeks ago by Romero Belling, MD  Please advise

## 2019-08-30 ENCOUNTER — Other Ambulatory Visit: Payer: Self-pay | Admitting: Endocrinology

## 2019-08-30 DIAGNOSIS — E109 Type 1 diabetes mellitus without complications: Secondary | ICD-10-CM

## 2019-08-30 NOTE — Telephone Encounter (Signed)
Please advise 

## 2019-09-13 ENCOUNTER — Other Ambulatory Visit: Payer: Self-pay

## 2019-09-13 ENCOUNTER — Telehealth: Payer: Self-pay | Admitting: Endocrinology

## 2019-09-13 DIAGNOSIS — E109 Type 1 diabetes mellitus without complications: Secondary | ICD-10-CM

## 2019-09-13 MED ORDER — BASAGLAR KWIKPEN 100 UNIT/ML ~~LOC~~ SOPN: 26.0000 [IU] | PEN_INJECTOR | Freq: Every day | SUBCUTANEOUS | 0 refills | Status: DC

## 2019-09-13 NOTE — Telephone Encounter (Signed)
MEDICATION: Insulin Glargine (BASAGLAR KWIKPEN) 100 UNIT/ML SOPN  PHARMACY:  [id number], [place], [place]  IS THIS A 90 DAY SUPPLY : Yes  IS PATIENT OUT OF MEDICATION: Yes  IF NOT; HOW MUCH IS LEFT: 0  LAST APPOINTMENT DATE: @2 /25/2021  NEXT APPOINTMENT DATE:@3 /25/2021  DO WE HAVE YOUR PERMISSION TO LEAVE A DETAILED MESSAGE: Yes  OTHER COMMENTS:    **Let patient know to contact pharmacy at the end of the day to make sure medication is ready. **  ** Please notify patient to allow 48-72 hours to process**  **Encourage patient to contact the pharmacy for refills or they can request refills through [facility]**

## 2019-09-13 NOTE — Telephone Encounter (Signed)
Outpatient Medication Detail   Disp Refills Start End   Insulin Glargine (BASAGLAR KWIKPEN) 100 UNIT/ML 8 mL 0 09/13/2019    Sig - Route: Inject 0.26 mLs (26 Units total) into the skin daily. - Subcutaneous   Sent to pharmacy as: Insulin Glargine (BASAGLAR KWIKPEN) 100 UNIT/ML   E-Prescribing Status: Receipt confirmed by pharmacy (09/13/2019 12:37 PM EST)

## 2019-09-23 ENCOUNTER — Other Ambulatory Visit: Payer: Self-pay | Admitting: Endocrinology

## 2019-09-23 DIAGNOSIS — E109 Type 1 diabetes mellitus without complications: Secondary | ICD-10-CM

## 2019-09-26 ENCOUNTER — Encounter: Payer: Self-pay | Admitting: Certified Nurse Midwife

## 2019-09-27 ENCOUNTER — Ambulatory Visit: Payer: BC Managed Care – PPO | Admitting: Endocrinology

## 2019-10-31 ENCOUNTER — Other Ambulatory Visit: Payer: Self-pay | Admitting: Endocrinology

## 2019-10-31 DIAGNOSIS — E109 Type 1 diabetes mellitus without complications: Secondary | ICD-10-CM

## 2019-10-31 NOTE — Telephone Encounter (Signed)
Please advise 

## 2019-10-31 NOTE — Telephone Encounter (Signed)
1.  Please schedule f/u appt 2.  Then please refill x 1, pending that appt.  

## 2019-11-08 ENCOUNTER — Emergency Department (HOSPITAL_COMMUNITY): Payer: BC Managed Care – PPO

## 2019-11-08 ENCOUNTER — Other Ambulatory Visit: Payer: Self-pay

## 2019-11-08 ENCOUNTER — Emergency Department (HOSPITAL_COMMUNITY)
Admission: EM | Admit: 2019-11-08 | Discharge: 2019-11-08 | Disposition: A | Discharge status: Home / Self Care | Payer: BC Managed Care – PPO | Attending: Emergency Medicine | Admitting: Emergency Medicine

## 2019-11-08 ENCOUNTER — Encounter (HOSPITAL_COMMUNITY): Payer: Self-pay | Admitting: Emergency Medicine

## 2019-11-08 DIAGNOSIS — Z79899 Other long term (current) drug therapy: Secondary | ICD-10-CM | POA: Insufficient documentation

## 2019-11-08 DIAGNOSIS — Y9389 Activity, other specified: Secondary | ICD-10-CM | POA: Insufficient documentation

## 2019-11-08 DIAGNOSIS — W25XXXA Contact with sharp glass, initial encounter: Secondary | ICD-10-CM | POA: Diagnosis not present

## 2019-11-08 DIAGNOSIS — Y92009 Unspecified place in unspecified non-institutional (private) residence as the place of occurrence of the external cause: Secondary | ICD-10-CM | POA: Insufficient documentation

## 2019-11-08 DIAGNOSIS — E119 Type 2 diabetes mellitus without complications: Secondary | ICD-10-CM | POA: Insufficient documentation

## 2019-11-08 DIAGNOSIS — Y999 Unspecified external cause status: Secondary | ICD-10-CM | POA: Insufficient documentation

## 2019-11-08 DIAGNOSIS — E039 Hypothyroidism, unspecified: Secondary | ICD-10-CM | POA: Diagnosis not present

## 2019-11-08 DIAGNOSIS — S91311A Laceration without foreign body, right foot, initial encounter: Secondary | ICD-10-CM | POA: Diagnosis not present

## 2019-11-08 DIAGNOSIS — Z794 Long term (current) use of insulin: Secondary | ICD-10-CM | POA: Insufficient documentation

## 2019-11-08 DIAGNOSIS — T1490XA Injury, unspecified, initial encounter: Secondary | ICD-10-CM

## 2019-11-08 LAB — CBG MONITORING, ED: Glucose-Capillary: 160 mg/dL — ABNORMAL HIGH (ref 70–99)

## 2019-11-08 MED ORDER — LIDOCAINE-EPINEPHRINE 2 %-1:100000 IJ SOLN
10.0000 mL | Freq: Once | INTRAMUSCULAR | Status: DC
  Filled 2019-11-08: qty 1

## 2019-11-08 MED ORDER — BACITRACIN ZINC 500 UNIT/GM EX OINT
TOPICAL_OINTMENT | Freq: Two times a day (BID) | CUTANEOUS | Status: DC
  Administered 2019-11-08: 1 via TOPICAL
  Filled 2019-11-08: qty 0.9

## 2019-11-08 MED ORDER — LIDOCAINE-EPINEPHRINE 2 %-1:100000 IJ SOLN
INTRAMUSCULAR | Status: AC
  Filled 2019-11-08: qty 1

## 2019-11-08 MED ORDER — HYDROCODONE-ACETAMINOPHEN 5-325 MG PO TABS
1.0000 | ORAL_TABLET | Freq: Once | ORAL | Status: AC
  Administered 2019-11-08: 1 via ORAL
  Filled 2019-11-08: qty 1

## 2019-11-08 NOTE — ED Triage Notes (Signed)
Per pt, states she dropped a glass on right foot-laceration to foot-bleeding controlled at this time

## 2019-11-08 NOTE — ED Provider Notes (Signed)
Casey Wolf DEPT Provider Note   CSN: [id number] Arrival date & time: 11/08/19  3662     History Chief Complaint  Patient presents with  . Foot Pain    Casey Wolf is a 24 y.o. female with PMH significant for IDDM who presents to the ED with a laceration to the bottom of her foot.  Patient reports that she felt mildly hypoglycemic this morning and knocked a glass off of her counter before subsequently stepping on it.  She states that there was a lot of blood at home.  She was brought in to the ER by her roommate.  She believes that her tetanus is up-to-date as it was required prior to starting college.  She denies any bleeding disorders, numbness or weakness, foreign body sensation, or other injury.  She is followed by [facility].  HPI     Past Medical History:  Diagnosis Date  . Anxiety   . Depression   . Diabetes mellitus without complication ([place])   . HPV in female   . Thyroid disease     Patient Active Problem List   Diagnosis Date Noted  . Hypothyroidism 02/01/2019  . Diabetes ([place]) 04/07/2017    Past Surgical History:  Procedure Laterality Date  . INTRAUTERINE DEVICE INSERTION     kyleena inserted 01-21-16?     OB History    Gravida  0   Para  0   Term  0   Preterm  0   AB  0   Living  0     SAB  0   TAB  0   Ectopic  0   Multiple  0   Live Births  0           Family History  Problem Relation Age of Onset  . Thyroid disease Father   . Diabetes Cousin     Social History   Tobacco Use  . Smoking status: Never Smoker  . Smokeless tobacco: Never Used  Substance Use Topics  . Alcohol use: No  . Drug use: No    Home Medications Prior to Admission medications   Medication Sig Start Date End Date Taking? Authorizing Provider  ibuprofen (ADVIL) 200 MG tablet Take 200-800 mg by mouth as needed for headache or moderate pain.   Yes [provider]  Insulin Glargine (BASAGLAR  KWIKPEN) 100 UNIT/ML Inject 0.26 mLs (26 Units total) into the skin daily. 09/13/19  Yes Renato Shin, MD  Levonorgestrel [facility]) 19.5 MG IUD 1 each by Intrauterine route daily.    Yes [provider]  NOVOLOG FLEXPEN 100 UNIT/ML FlexPen PER CARBOHYDRATE RATIO, FOR A TOTAL OF 70 UNITS/DAY 08/30/19  Yes Renato Shin, MD  SYNTHROID 150 MCG tablet TAKE 1 TABLET BY MOUTH DAILY BEFORE BREAKFAST. NEED APPT Patient taking differently: Take 150 mcg by mouth daily before breakfast.  08/29/19  Yes Renato Shin, MD  valACYclovir (VALTREX) 1000 MG tablet Take 1,000 mg by mouth daily as needed (flareups).   Yes [provider]  ACCU-CHEK GUIDE test strip USE TO TEST 6 (SIX) TIMES DAILY FOR 30 DAYS- NEED APPT 09/23/19   Renato Shin, MD  Blood Glucose Monitoring Suppl (ACCU-CHEK GUIDE) w/Device KIT 1 Device by Does not apply route daily as needed. Patient taking differently: 1 Device by Does not apply route 6 (six) times daily.  01/26/18   Renato Shin, MD  Insulin Pen Needle (PEN NEEDLES 31GX5/16") 31G X 8 MM MISC 1 each by Does not  apply route daily. Use to inject insulin 5 times daily 04/06/19   Renato Shin, MD  Lancets (ACCU-CHEK MULTICLIX) lancets Used to check blood sugars four times daily. Patient taking differently: 1 each by Other route 6 (six) times daily. Used to check blood sugars four times daily. 01/26/18   Renato Shin, MD    Allergies    Amoxicillin  Review of Systems   Review of Systems  Constitutional: Negative for fever.  Skin: Positive for wound.  Neurological: Negative for weakness and numbness.    Physical Exam Updated Vital Signs BP 109/75 (BP Location: Left Arm)   Pulse 84   Temp 98.4 F (36.9 C)   Resp 16   LMP 10/25/2019 (Approximate)   SpO2 100%   Physical Exam Vitals and nursing note reviewed. Exam conducted with a chaperone present.  Constitutional:      Appearance: Normal appearance.  HENT:     Head: Normocephalic and atraumatic.  Eyes:      General: No scleral icterus.    Conjunctiva/sclera: Conjunctivae normal.  Pulmonary:     Effort: Pulmonary effort is normal.  Musculoskeletal:     Comments: Right foot: ROM and strength intact.  Can wiggle toes.  Sensation intact throughout.  Pedal pulse and cap refill intact.  Skin:    General: Skin is dry.     Comments: 4 cm laceration to plantar aspect right foot.  Bleeding poorly controlled.  Neurological:     Mental Status: She is alert.     GCS: GCS eye subscore is 4. GCS verbal subscore is 5. GCS motor subscore is 6.  Psychiatric:        Mood and Affect: Mood normal.        Behavior: Behavior normal.        Thought Content: Thought content normal.       ED Results / Procedures / Treatments   Labs (all labs ordered are listed, but only abnormal results are displayed) Labs Reviewed  CBG MONITORING, ED - Abnormal; Notable for the following components:      Result Value   Glucose-Capillary 160 (*)    All other components within normal limits    EKG None  Radiology DG Foot Complete Right  Result Date: 11/08/2019 CLINICAL DATA:  Laceration to bottom of foot. Cut with glass. EXAM: RIGHT FOOT COMPLETE - 3+ VIEW COMPARISON:  None. FINDINGS: No evidence for fracture or dislocation. No worrisome lytic or sclerotic osseous abnormality. Substantial overlying bandage material could hinder assessment for subtle radiopaque foreign body, but within this limitation, no unexpected radiopaque soft tissue foreign body identified. IMPRESSION: No acute bony abnormality. No unexpected radiopaque soft tissue foreign body. Electronically Signed   By: Misty Stanley M.D.   On: 11/08/2019 08:13    Procedures .Marland KitchenLaceration Repair  Date/Time: 11/08/2019 10:18 AM Performed by: Corena Herter, PA-C Authorized by: Corena Herter, PA-C   Consent:    Consent obtained:  Verbal   Consent given by:  Patient   Risks discussed:  Infection, need for additional repair, pain, poor cosmetic result, poor  wound healing, nerve damage, retained foreign body, tendon damage and vascular damage   Alternatives discussed:  No treatment and delayed treatment Universal protocol:    Procedure explained and questions answered to patient or proxy's satisfaction: yes     Relevant documents present and verified: yes     Test results available and properly labeled: yes     Imaging studies available: yes     Required blood  products, implants, devices, and special equipment available: yes     Site/side marked: yes     Immediately prior to procedure, a time out was called: yes     Patient identity confirmed:  Verbally with patient Anesthesia (see MAR for exact dosages):    Anesthesia method:  Local infiltration   Local anesthetic:  Lidocaine 2% WITH epi Laceration details:    Location:  Foot   Foot location:  Sole of R foot   Length (cm):  4 Repair type:    Repair type:  Simple Pre-procedure details:    Preparation:  Patient was prepped and draped in usual sterile fashion Exploration:    Hemostasis achieved with:  Epinephrine and direct pressure   Wound exploration: wound explored through full range of motion   Treatment:    Area cleansed with:  Saline   Amount of cleaning:  Extensive   Irrigation volume:  1000 mL   Irrigation method:  Tap   Visualized foreign bodies/material removed: no   Skin repair:    Repair method:  Sutures   Suture size:  4-0   Suture material:  Prolene   Number of sutures:  6 Post-procedure details:    Dressing:  Sterile dressing and antibiotic ointment   Patient tolerance of procedure:  Tolerated well, no immediate complications   (including critical care time)  Medications Ordered in ED Medications  lidocaine-EPINEPHrine (XYLOCAINE W/EPI) 2 %-1:100000 (with pres) injection 10 mL ( Infiltration Canceled Entry 11/08/19 0947)  bacitracin ointment (has no administration in time range)  HYDROcodone-acetaminophen (NORCO/VICODIN) 5-325 MG per tablet 1 tablet (1 tablet Oral  Given 11/08/19 0916)  lidocaine-EPINEPHrine (XYLOCAINE W/EPI) 2 %-1:100000 (with pres) injection (  Given by Other 11/08/19 9678)    ED Course  I have reviewed the triage vital signs and the nursing notes.  Pertinent labs & imaging results that were available during my care of the patient were reviewed by me and considered in my medical decision making (see chart for details).    MDM Rules/Calculators/A&P                      Before and after the procedure the motor function, strength, sensation, and circulatory function was assessed.  After the procedure patient had intact motor function with 5/5 strength (equal to unaffected side) to all joints of the foot with the only sensory changes attributable to the local anesthetic.  Patient denies any weakness numbness and she is able to demonstrate full ROM and strength on exam.  Her laceration was bleeding profusely, but she denies any headache, dizziness, lightheadedness, chest discomfort, or other symptoms concerning for anemia.  Her vital signs are stable and within normal limits.  She is resting comfortably on exam.  I was able to repair the laceration using 6 4-0 sutures.  Patient also has a small, nonbleeding, well approximated laceration involving the big toe on the same foot.  However, no need for repair.  Instructed patient to have suture removal in 16-18 days.  Given the location of her laceration, will apply topical antibiotics here in the ED and encouraged her to apply it twice daily.  We will also provide wound care instructions.  Patient reports that her tetanus is up-to-date.  Pain control with Vicodin here in the ED.  She will have a ride to go home.  Will provide crutches.  Patient follow-up with her primary care provider for ongoing evaluation and management.  Strict ED return precautions discussed.  All  of the evaluation and work-up results were discussed with the patient and any family at bedside. They were provided opportunity to ask any  additional questions and have none at this time. They have expressed understanding of verbal discharge instructions as well as return precautions and are agreeable to the plan.    Final Clinical Impression(s) / ED Diagnoses Final diagnoses:  Injury  Laceration of right foot, initial encounter    Rx / DC Orders ED Discharge Orders    None       Corena Herter, PA-C 11/08/19 1104    Daleen Bo, MD 11/08/19 1752

## 2019-11-08 NOTE — Discharge Instructions (Addendum)
Please read the attachment on laceration care regarding wound care instructions.  You will need to follow-up with your primary care provider for ongoing evaluation and management.  Please schedule appointment with them for wound check in the next 3 to 5 days.  You will ultimately need suture removal in 16 to 18 days.  Please continue to apply the topical antibiotic twice daily.  Tylenol or ibuprofen as needed for pain symptoms.  You were given narcotic and or sedative medications while in the emergency department. Do not drive. Do not use machinery or power tools. Do not sign legal documents. Do not drink alcohol. Do not take sleeping pills. Do not supervise children by yourself. Do not participate in activities that require climbing or being in high places.  Return to the ED or seek immediate medical attention should experience any new or worsening symptoms.

## 2019-11-08 NOTE — ED Notes (Signed)
PA at bedside.

## 2019-11-09 NOTE — Progress Notes (Signed)
Orthopedic Tech Progress Note Patient Details:  Casey Wolf [birth year] [id number]  Ortho Devices Type of Ortho Device: Crutches       Casey Wolf 11/09/2019, 6:45 AM

## 2019-11-14 ENCOUNTER — Other Ambulatory Visit: Payer: Self-pay | Admitting: Endocrinology

## 2019-11-14 DIAGNOSIS — E109 Type 1 diabetes mellitus without complications: Secondary | ICD-10-CM

## 2019-11-14 NOTE — Telephone Encounter (Signed)
1.  Please schedule f/u appt 2.  Then please refill x 1, pending that appt.  

## 2019-11-14 NOTE — Telephone Encounter (Signed)
Please advise 

## 2019-11-21 ENCOUNTER — Telehealth (INDEPENDENT_AMBULATORY_CARE_PROVIDER_SITE_OTHER): Payer: BC Managed Care – PPO | Admitting: Endocrinology

## 2019-11-21 ENCOUNTER — Other Ambulatory Visit: Payer: Self-pay

## 2019-11-21 ENCOUNTER — Telehealth: Payer: Self-pay

## 2019-11-21 DIAGNOSIS — E109 Type 1 diabetes mellitus without complications: Secondary | ICD-10-CM | POA: Diagnosis not present

## 2019-11-21 MED ORDER — ACCU-CHEK GUIDE VI STRP: 1.0000 | ORAL_STRIP | 0 refills | Status: DC

## 2019-11-21 MED ORDER — "PEN NEEDLES 5/16"" 31G X 8 MM MISC": 1.0000 | Freq: Every day | 3 refills | Status: DC

## 2019-11-21 MED ORDER — BASAGLAR KWIKPEN 100 UNIT/ML ~~LOC~~ SOPN: 26.0000 [IU] | PEN_INJECTOR | Freq: Every day | SUBCUTANEOUS | 3 refills | Status: DC

## 2019-11-21 MED ORDER — NOVOLOG FLEXPEN 100 UNIT/ML ~~LOC~~ SOPN: PEN_INJECTOR | SUBCUTANEOUS | 3 refills | Status: AC

## 2019-11-21 NOTE — Telephone Encounter (Signed)
Pt was seen today for VV with Dr. Everardo All. Per Teams message from Dr. Everardo All, pt will need to schedule lab appt. She will NOT be required to have her A1C drawn by nurse. States he is ordering her A1C to be drawn along with her other routine labs. Pt will call to schedule appt. Routing to Clerical staff to inform them about his request and for scheduling purposes.

## 2019-11-21 NOTE — Progress Notes (Signed)
Subjective:    Patient ID: Casey Wolf, female    DOB: 05-17-1996, 24 y.o.   MRN: [id number]  HPI  telehealth visit today via video visit.  Alternatives to telehealth are presented to this patient, and the patient agrees to the telehealth visit. Pt is advised of the cost of the visit, and agrees to this, also.   Patient is at home, and I am at the office.   Persons attending the telehealth visit: the patient and I.   Pt returns for f/u of diabetes mellitus: DM type: Insulin-requiring type 2 Dx'ed: 8756 Complications: none Therapy: insulin since dx GDM: never (G0) DKA: never Severe hypoglycemia: never.  Pancreatitis: never Pancreatic imaging: never.   Other: she declines pump and continuous glucose monitor, at least for now; she takes multiple daily injections.   Interval history: She has mild hypoglycemia approx QOD.  This happens fasting.  She says cbg varies from 60-350.  It is in general highest in the afternoon.  She averages a total of approx 40-60 units of Novolog per day.  Pt says she misses 1 dose of insulin per week.    Past Medical History:  Diagnosis Date  . Anxiety   . Depression   . Diabetes mellitus without complication ([place])   . HPV in female   . Thyroid disease     Past Surgical History:  Procedure Laterality Date  . INTRAUTERINE DEVICE INSERTION     kyleena inserted 01-21-16?    Social History   Socioeconomic History  . Marital status: Single    Spouse name: Not on file  . Number of children: Not on file  . Years of education: Not on file  . Highest education level: Not on file  Occupational History  . Not on file  Tobacco Use  . Smoking status: Never Smoker  . Smokeless tobacco: Never Used  Substance and Sexual Activity  . Alcohol use: No  . Drug use: No  . Sexual activity: Yes    Partners: Female, Female    Birth control/protection: I.U.D.  Other Topics Concern  . Not on file  Social History Narrative  . Not on file   Social  Determinants of Health   Financial Resource Strain:   . Difficulty of Paying Living Expenses:   Food Insecurity:   . Worried About Charity fundraiser in the Last Year:   . Arboriculturist in the Last Year:   Transportation Needs:   . Film/video editor (Medical):   Marland Kitchen Lack of Transportation (Non-Medical):   Physical Activity:   . Days of Exercise per Week:   . Minutes of Exercise per Session:   Stress:   . Feeling of Stress :   Social Connections:   . Frequency of Communication with Friends and Family:   . Frequency of Social Gatherings with Friends and Family:   . Attends Religious Services:   . Active Member of Clubs or Organizations:   . Attends Archivist Meetings:   Marland Kitchen Marital Status:   Intimate Partner Violence:   . Fear of Current or Ex-Partner:   . Emotionally Abused:   Marland Kitchen Physically Abused:   . Sexually Abused:     Current Outpatient Medications on File Prior to Visit  Medication Sig Dispense Refill  . ibuprofen (ADVIL) 200 MG tablet Take 200-800 mg by mouth as needed for headache or moderate pain.    . Levonorgestrel (KYLEENA) 19.5 MG IUD 1 each by Intrauterine route daily.     Marland Kitchen  SYNTHROID 150 MCG tablet TAKE 1 TABLET BY MOUTH DAILY BEFORE BREAKFAST. NEED APPT (Patient taking differently: Take 150 mcg by mouth daily before breakfast. ) 90 tablet 1  . valACYclovir (VALTREX) 1000 MG tablet Take 1,000 mg by mouth daily as needed (flareups).     No current facility-administered medications on file prior to visit.    Allergies  Allergen Reactions  . Amoxicillin Hives    Family History  Problem Relation Age of Onset  . Thyroid disease Father   . Diabetes Cousin     LMP 10/25/2019 (Approximate)    Review of Systems Denies LOC    Objective:   Physical Exam       Assessment & Plan:  Insulin-requiring type 2 DM: uncertain glycemic control.  Hypoglycemia, due to insulin: this limits aggressiveness of glycemic control. We'll reduce basaglar  when the A1c tells [place] what other changes to make   Patient Instructions  check your blood sugar 6 times a day: before the 3 meals, and at bedtime.  also check if you have symptoms of your blood sugar being too high or too low.  please keep a record of the readings and bring it to your next appointment here (or you can bring the meter itself).  You can write it on any piece of paper.  please call us sooner if your blood sugar goes below 70, or if you have a lot of readings over 200.   For now, Please continue the same basaglar at bedtime, and:  Take novolog, 1 unit/4 grams with meals.  However, take 2 extra units with breakfast.  Also, subtract 1 unit if you are going to be active.   Please come in to have the A1c.  Please call ahead.  We'll let you know about the results.  Based on the results, we'll reduce the Basaglar.  We probably also need to inrease the Novolog.   Please come back for a follow-up appointment in 2 months.

## 2019-11-21 NOTE — Patient Instructions (Addendum)
check your blood sugar 6 times a day: before the 3 meals, and at bedtime.  also check if you have symptoms of your blood sugar being too high or too low.  please keep a record of the readings and bring it to your next appointment here (or you can bring the meter itself).  You can write it on any piece of paper.  please call us sooner if your blood sugar goes below 70, or if you have a lot of readings over 200.   For now, Please continue the same basaglar at bedtime, and:  Take novolog, 1 unit/4 grams with meals.  However, take 2 extra units with breakfast.  Also, subtract 1 unit if you are going to be active.   Please come in to have the A1c.  Please call ahead.  We'll let you know about the results.  Based on the results, we'll reduce the Basaglar.  We probably also need to inrease the Novolog.   Please come back for a follow-up appointment in 2 months.

## 2019-11-23 NOTE — Telephone Encounter (Signed)
LMTCB to set up lab appt for pt  According to notes she also needs a 2 month fu.

## 2019-12-04 ENCOUNTER — Other Ambulatory Visit: Payer: Self-pay | Admitting: Endocrinology

## 2019-12-04 DIAGNOSIS — E109 Type 1 diabetes mellitus without complications: Secondary | ICD-10-CM

## 2020-01-23 ENCOUNTER — Ambulatory Visit: Payer: BC Managed Care – PPO | Admitting: Endocrinology

## 2020-01-23 DIAGNOSIS — Z0289 Encounter for other administrative examinations: Secondary | ICD-10-CM

## 2020-02-14 ENCOUNTER — Other Ambulatory Visit: Payer: Self-pay | Admitting: Endocrinology

## 2020-02-14 DIAGNOSIS — E109 Type 1 diabetes mellitus without complications: Secondary | ICD-10-CM

## 2020-02-22 ENCOUNTER — Other Ambulatory Visit: Payer: Self-pay

## 2020-02-22 ENCOUNTER — Telehealth: Payer: Self-pay | Admitting: Endocrinology

## 2020-02-22 DIAGNOSIS — E039 Hypothyroidism, unspecified: Secondary | ICD-10-CM

## 2020-02-22 MED ORDER — LEVOTHYROXINE SODIUM 150 MCG PO TABS: 150.0000 ug | ORAL_TABLET | Freq: Every day | ORAL | 1 refills | Status: DC

## 2020-02-22 NOTE — Telephone Encounter (Signed)
1.  Please schedule f/u appt 2.  Then please refill x 2 mos, pending that appt.  

## 2020-02-22 NOTE — Telephone Encounter (Signed)
Please advise. Not seeing any updated labs since 2019

## 2020-02-22 NOTE — Telephone Encounter (Signed)
Medication Refill Request  Did you call your pharmacy and request this refill first? Yes  . If patient has not contacted pharmacy first, instruct them to do so for future refills.  . Remind them that contacting the pharmacy for their refill is the quickest method to get the refill.  . Refill policy also stated that it will take anywhere between 24-72 hours to receive the refill.    Name of medication? synthroid  Is this a 90 day supply? Yes  Name and location of pharmacy?  [id number] [place], [place] - [address] Phone:  [phone]  Fax:  [phone]

## 2020-02-22 NOTE — Telephone Encounter (Signed)
Outpatient Medication Detail   Disp Refills Start End   levothyroxine (SYNTHROID) 150 MCG tablet 30 tablet 1 02/22/2020    Sig - Route: Take 1 tablet (150 mcg total) by mouth daily before breakfast. OVERDUE FOR LABS. WILL PROVIDE ENOUGH TABLETS UNTIL LABS CAN BE COMPLETED - Oral   Sent to pharmacy as: levothyroxine (SYNTHROID) 150 MCG tablet   E-Prescribing Status: Receipt confirmed by pharmacy (02/22/2020  4:46 PM EDT)

## 2020-02-26 ENCOUNTER — Other Ambulatory Visit: Payer: Self-pay | Admitting: Endocrinology

## 2020-02-26 ENCOUNTER — Telehealth (INDEPENDENT_AMBULATORY_CARE_PROVIDER_SITE_OTHER): Payer: BC Managed Care – PPO | Admitting: Endocrinology

## 2020-02-26 ENCOUNTER — Other Ambulatory Visit: Payer: Self-pay

## 2020-02-26 DIAGNOSIS — E109 Type 1 diabetes mellitus without complications: Secondary | ICD-10-CM | POA: Diagnosis not present

## 2020-02-26 DIAGNOSIS — E039 Hypothyroidism, unspecified: Secondary | ICD-10-CM

## 2020-02-26 MED ORDER — LEVOTHYROXINE SODIUM 150 MCG PO TABS: 150.0000 ug | ORAL_TABLET | Freq: Every day | ORAL | 3 refills | Status: DC

## 2020-02-26 NOTE — Patient Instructions (Addendum)
check your blood sugar 6 times a day: before the 3 meals, and at bedtime.  also check if you have symptoms of your blood sugar being too high or too low.  please keep a record of the readings and bring it to your next appointment here (or you can bring the meter itself).  You can write it on any piece of paper.  please call us sooner if your blood sugar goes below 70, or if you have a lot of readings over 200.   For now, Please continue the same basaglar at bedtime, and:  Take novolog, 1 unit/4 grams with meals.  However, take 2 extra units with breakfast.  Also, subtract 1 unit if you are going to be active.   Please have the A1c done at [facility].    Based on the results, we'll reduce the Basaglar.  We probably also need to inrease the Novolog.   Please come back for a follow-up appointment in 3 months, if you are not set up with a new dr there by then.

## 2020-02-26 NOTE — Progress Notes (Signed)
Subjective:    Patient ID: Casey Wolf, female    DOB: [birth year], 24 y.o.   MRN: [id number]  HPI telehealth visit today via video visit.  Alternatives to telehealth are presented to this patient, and the patient agrees to the telehealth visit. Pt is advised of the cost of the visit, and agrees to this, also.   Patient is at home, and I am at the office.   Persons attending the telehealth visit: the patient and I.   Pt returns for f/u of diabetes mellitus: DM type: Insulin-requiring type 2 Dx'ed: 2012 Complications: none Therapy: insulin since dx GDM: never (G0) DKA: never Severe hypoglycemia: never.  Pancreatitis: never Pancreatic imaging: never.   Other: she declines pump and continuous glucose monitor, at least for now; she takes multiple daily injections.   Interval history: She has mild hypoglycemia approx weekly.  This still happens fasting.  She says cbg varies from 52-330.  It is in general highest in the afternoon.  She averages a total of approx 45-60 units of Novolog per day.  Pt says she seldom misses the Basaglar.   She has just moved to [place].  She wants synthroid DAW. Past Medical History:  Diagnosis Date  . Anxiety   . Depression   . Diabetes mellitus without complication (HCC)   . HPV in female   . Thyroid disease     Past Surgical History:  Procedure Laterality Date  . INTRAUTERINE DEVICE INSERTION     kyleena inserted 01-21-16?    Social History   Socioeconomic History  . Marital status: Single    Spouse name: Not on file  . Number of children: Not on file  . Years of education: Not on file  . Highest education level: Not on file  Occupational History  . Not on file  Tobacco Use  . Smoking status: Never Smoker  . Smokeless tobacco: Never Used  Substance and Sexual Activity  . Alcohol use: No  . Drug use: No  . Sexual activity: Yes    Partners: Female, Female    Birth control/protection: I.U.D.  Other Topics Concern  . Not on file  Social  History Narrative  . Not on file   Social Determinants of Health   Financial Resource Strain:   . Difficulty of Paying Living Expenses: Not on file  Food Insecurity:   . Worried About Programme researcher, broadcasting/film/video in the Last Year: Not on file  . Ran Out of Food in the Last Year: Not on file  Transportation Needs:   . Lack of Transportation (Medical): Not on file  . Lack of Transportation (Non-Medical): Not on file  Physical Activity:   . Days of Exercise per Week: Not on file  . Minutes of Exercise per Session: Not on file  Stress:   . Feeling of Stress : Not on file  Social Connections:   . Frequency of Communication with Friends and Family: Not on file  . Frequency of Social Gatherings with Friends and Family: Not on file  . Attends Religious Services: Not on file  . Active Member of Clubs or Organizations: Not on file  . Attends Banker Meetings: Not on file  . Marital Status: Not on file  Intimate Partner Violence:   . Fear of Current or Ex-Partner: Not on file  . Emotionally Abused: Not on file  . Physically Abused: Not on file  . Sexually Abused: Not on file    Current Outpatient Medications on File Prior  to Visit  Medication Sig Dispense Refill  . glucose blood (ACCU-CHEK GUIDE) test strip 1 each by Other route See admin instructions. 6 times per day, and lancets 6/day 540 strip 0  . ibuprofen (ADVIL) 200 MG tablet Take 200-800 mg by mouth as needed for headache or moderate pain.    Marland Kitchen insulin aspart (NOVOLOG FLEXPEN) 100 UNIT/ML FlexPen Per carbohydrate ratio, total of 70 units per day 25 pen 3  . Insulin Glargine (BASAGLAR KWIKPEN) 100 UNIT/ML Inject 0.26 mLs (26 Units total) into the skin daily. 10 pen 3  . Insulin Pen Needle (BD PEN NEEDLE NANO 2ND GEN) 32G X 4 MM MISC 1 each by Other route 2 (two) times daily. E11.9 60 each 2  . Levonorgestrel (KYLEENA) 19.5 MG IUD 1 each by Intrauterine route daily.     . valACYclovir (VALTREX) 1000 MG tablet Take 1,000 mg by  mouth daily as needed (flareups).     No current facility-administered medications on file prior to visit.    Allergies  Allergen Reactions  . Amoxicillin Hives    Family History  Problem Relation Age of Onset  . Thyroid disease Father   . Diabetes Cousin     There were no vitals taken for this visit.  Review of Systems She denies LOC    Objective:   Physical Exam        Assessment & Plan:  Type 1 DM: The pattern of his cbg's indicates he needs some adjustment in his therapy Hypoglycemia, due to insulin: this limits aggressiveness of glycemic control Hypothyroidism: recheck soon.   Patient Instructions  check your blood sugar 6 times a day: before the 3 meals, and at bedtime.  also check if you have symptoms of your blood sugar being too high or too low.  please keep a record of the readings and bring it to your next appointment here (or you can bring the meter itself).  You can write it on any piece of paper.  please call us sooner if your blood sugar goes below 70, or if you have a lot of readings over 200.   For now, Please continue the same basaglar at bedtime, and:  Take novolog, 1 unit/4 grams with meals.  However, take 2 extra units with breakfast.  Also, subtract 1 unit if you are going to be active.   Please have the A1c done at [facility].    Based on the results, we'll reduce the Basaglar.  We probably also need to inrease the Novolog.   Please come back for a follow-up appointment in 3 months, if you are not set up with a new dr there by then.

## 2020-03-05 ENCOUNTER — Other Ambulatory Visit: Payer: Self-pay

## 2020-03-05 ENCOUNTER — Telehealth (INDEPENDENT_AMBULATORY_CARE_PROVIDER_SITE_OTHER): Payer: BC Managed Care – PPO | Admitting: Endocrinology

## 2020-03-05 DIAGNOSIS — E109 Type 1 diabetes mellitus without complications: Secondary | ICD-10-CM

## 2020-03-05 DIAGNOSIS — E875 Hyperkalemia: Secondary | ICD-10-CM

## 2020-03-05 LAB — BASIC METABOLIC PANEL
BUN/Creatinine Ratio: 13 (ref 9–23)
BUN: 10 mg/dL (ref 6–20)
CO2: 22 mmol/L (ref 20–29)
Calcium: 9.8 mg/dL (ref 8.7–10.2)
Chloride: 95 mmol/L — ABNORMAL LOW (ref 96–106)
Creatinine, Ser: 0.79 mg/dL (ref 0.57–1.00)
GFR calc Af Amer: 123 mL/min/{1.73_m2} (ref >59)
GFR calc non Af Amer: 107 mL/min/{1.73_m2} (ref >59)
Glucose: 416 mg/dL — ABNORMAL HIGH (ref 65–99)
Potassium: 6.1 mmol/L — ABNORMAL HIGH (ref 3.5–5.2)
Sodium: 132 mmol/L — ABNORMAL LOW (ref 134–144)

## 2020-03-05 LAB — TSH: TSH: 0.76 u[IU]/mL (ref 0.450–4.500)

## 2020-03-05 LAB — HEMOGLOBIN A1C
Est. average glucose Bld gHb Est-mCnc: 209 mg/dL
Hgb A1c MFr Bld: 8.9 % — ABNORMAL HIGH (ref 4.8–5.6)

## 2020-03-05 MED ORDER — FLUDROCORTISONE ACETATE 0.1 MG PO TABS: 0.1000 mg | ORAL_TABLET | Freq: Every day | ORAL | 5 refills | Status: AC

## 2020-03-05 MED ORDER — SYNTHROID 150 MCG PO TABS: 150.0000 ug | ORAL_TABLET | Freq: Every day | ORAL | 3 refills | Status: DC

## 2020-03-05 MED ORDER — BASAGLAR KWIKPEN 100 UNIT/ML ~~LOC~~ SOPN: 24.0000 [IU] | PEN_INJECTOR | Freq: Every day | SUBCUTANEOUS | 3 refills | Status: AC

## 2020-03-05 MED ORDER — ACCU-CHEK GUIDE VI STRP: 1.0000 | ORAL_STRIP | 3 refills | Status: AC

## 2020-03-05 NOTE — Patient Instructions (Addendum)
check your blood sugar 6 times a day: before the 3 meals, and at bedtime.  also check if you have symptoms of your blood sugar being too high or too low.  please keep a record of the readings and bring it to your next appointment here (or you can bring the meter itself).  You can write it on any piece of paper.  please call us sooner if your blood sugar goes below 70, or if you have a lot of readings over 200.   Take novolog, 1 unit/3.5 grams with meals.  However, take 2 extra units with breakfast.  Also, subtract 2 units if you are going to be active.   Please reduce the Basaglar to 24 units qd.  Please come back for a follow-up appointment in 3 months, if you are not set up with a new dr there by then.   I have sent a prescription to your pharmacy, for the pill for the potassium.

## 2020-03-05 NOTE — Progress Notes (Signed)
Subjective:    Patient ID: Casey Wolf, female    DOB: [birth year], 24 y.o.   MRN: [id number]  HPI  telehealth visit today via video visit.  Alternatives to telehealth are presented to this patient, and the patient agrees to the telehealth visit. Pt is advised of the cost of the visit, and agrees to this, also.   Patient is at home, and I am at the office.   Persons attending the telehealth visit: the patient and I Pt returns for f/u of diabetes mellitus:  DM type: Insulin-requiring type 2 Dx'ed: 2012 Complications: none Therapy: insulin since dx GDM: never (G0) DKA: never Severe hypoglycemia: never.  Pancreatitis: never Pancreatic imaging: never.   Other: she declines pump and continuous glucose monitor, at least for now; she takes multiple daily injections; She requests synthroid DAW; She has just moved to [place] Interval history: She has mild hypoglycemia approx BIW.  This usually happens in the late afternoon, with activity.  Novolog is 1 unit/4g CHO.   She has slight postural dizziness.   Past Medical History:  Diagnosis Date   Anxiety    Depression    Diabetes mellitus without complication (HCC)    HPV in female    Thyroid disease     Past Surgical History:  Procedure Laterality Date   INTRAUTERINE DEVICE INSERTION     kyleena inserted 01-21-16?    Social History   Socioeconomic History   Marital status: Single    Spouse name: Not on file   Number of children: Not on file   Years of education: Not on file   Highest education level: Not on file  Occupational History   Not on file  Tobacco Use   Smoking status: Never Smoker   Smokeless tobacco: Never Used  Substance and Sexual Activity   Alcohol use: No   Drug use: No   Sexual activity: Yes    Partners: Female, Female    Birth control/protection: I.U.D.  Other Topics Concern   Not on file  Social History Narrative   Not on file   Social Determinants of Health   Financial Resource  Strain:    Difficulty of Paying Living Expenses: Not on file  Food Insecurity:    Worried About Running Out of Food in the Last Year: Not on file   Ran Out of Food in the Last Year: Not on file  Transportation Needs:    Lack of Transportation (Medical): Not on file   Lack of Transportation (Non-Medical): Not on file  Physical Activity:    Days of Exercise per Week: Not on file   Minutes of Exercise per Session: Not on file  Stress:    Feeling of Stress : Not on file  Social Connections:    Frequency of Communication with Friends and Family: Not on file   Frequency of Social Gatherings with Friends and Family: Not on file   Attends Religious Services: Not on file   Active Member of Clubs or Organizations: Not on file   Attends Banker Meetings: Not on file   Marital Status: Not on file  Intimate Partner Violence:    Fear of Current or Ex-Partner: Not on file   Emotionally Abused: Not on file   Physically Abused: Not on file   Sexually Abused: Not on file    Current Outpatient Medications on File Prior to Visit  Medication Sig Dispense Refill   ibuprofen (ADVIL) 200 MG tablet Take 200-800 mg by mouth as needed for  headache or moderate pain.     insulin aspart (NOVOLOG FLEXPEN) 100 UNIT/ML FlexPen Per carbohydrate ratio, total of 70 units per day 25 pen 3   Insulin Pen Needle (BD PEN NEEDLE NANO 2ND GEN) 32G X 4 MM MISC 1 each by Other route 2 (two) times daily. E11.9 60 each 2   Levonorgestrel (KYLEENA) 19.5 MG IUD 1 each by Intrauterine route daily.      valACYclovir (VALTREX) 1000 MG tablet Take 1,000 mg by mouth daily as needed (flareups).     No current facility-administered medications on file prior to visit.    Allergies  Allergen Reactions   Amoxicillin Hives    Family History  Problem Relation Age of Onset   Thyroid disease Father    Diabetes Cousin     There were no vitals taken for this visit.    Review of Systems      Objective:   Physical Exam    A1c=8.9%    Assessment & Plan:  Type 1 DM:: she needs increased rx Hyperkalemia, new--? addison's  Patient Instructions  check your blood sugar 6 times a day: before the 3 meals, and at bedtime.  also check if you have symptoms of your blood sugar being too high or too low.  please keep a record of the readings and bring it to your next appointment here (or you can bring the meter itself).  You can write it on any piece of paper.  please call us sooner if your blood sugar goes below 70, or if you have a lot of readings over 200.   Take novolog, 1 unit/3.5 grams with meals.  However, take 2 extra units with breakfast.  Also, subtract 2 units if you are going to be active.   Please reduce the Basaglar to 24 units qd.  Please come back for a follow-up appointment in 3 months, if you are not set up with a new dr there by then.   I have sent a prescription to your pharmacy, for the pill for the potassium.   please check labs next week at [facility] [place]

## 2020-03-08 DIAGNOSIS — E875 Hyperkalemia: Secondary | ICD-10-CM | POA: Insufficient documentation

## 2020-03-11 ENCOUNTER — Telehealth: Payer: Self-pay | Admitting: Endocrinology

## 2020-03-11 ENCOUNTER — Other Ambulatory Visit: Payer: Self-pay

## 2020-03-11 DIAGNOSIS — E875 Hyperkalemia: Secondary | ICD-10-CM

## 2020-03-11 NOTE — Telephone Encounter (Signed)
Lab Technician also contacted [facility] and verified that these labs were viewable.  The control number is [id number].

## 2020-03-11 NOTE — Telephone Encounter (Signed)
Casey Wolf, will you please release these really quickly?

## 2020-03-11 NOTE — Telephone Encounter (Signed)
Pt called back and stated that [facility] still states that they cannot see the labs. Spoke with lab technician and she states that they have definitely been released because she printed the full requisitions and had them in hand at the time of the conversation. Lab tech instructed that the [facility] location should contact [facility] at [phone] and press option 1 to verify with [facility] that the orders are there.  Attempted to call pt back and inform her of this, but she did not answer. A detailed voicemail was left for this pt and encouraged her to call this office back if she has any additional questions.

## 2020-03-11 NOTE — Telephone Encounter (Signed)
Patient is at a [facility] in [place] and she says she needs to have labs done but they need the orders. Please advise.  [facility] [address] [place], [place] [address] Ph# [phone]

## 2020-04-02 LAB — BASIC METABOLIC PANEL
BUN/Creatinine Ratio: 15 (ref 9–23)
BUN: 12 mg/dL (ref 6–20)
CO2: 23 mmol/L (ref 20–29)
Calcium: 9.8 mg/dL (ref 8.7–10.2)
Chloride: 96 mmol/L (ref 96–106)
Creatinine, Ser: 0.79 mg/dL (ref 0.57–1.00)
GFR calc Af Amer: 123 mL/min/{1.73_m2} (ref >59)
GFR calc non Af Amer: 107 mL/min/{1.73_m2} (ref >59)
Glucose: 407 mg/dL — ABNORMAL HIGH (ref 65–99)
Potassium: 5.4 mmol/L — ABNORMAL HIGH (ref 3.5–5.2)
Sodium: 133 mmol/L — ABNORMAL LOW (ref 134–144)

## 2020-04-02 LAB — ACTH: ACTH: 17.6 pg/mL (ref 7.2–63.3)

## 2020-04-02 LAB — CORTISOL: Cortisol: 10.4 ug/dL

## 2020-12-04 ENCOUNTER — Other Ambulatory Visit: Payer: Self-pay | Admitting: Endocrinology

## 2021-05-07 IMAGING — DX DG FOOT COMPLETE 3+V*R*
3 series · 3 of 3 positions shown · non-contrast
Comparison: None.

CLINICAL DATA: Laceration to bottom of foot. Cut with glass.

EXAM:
RIGHT FOOT COMPLETE - 3+ VIEW

[foot ap]
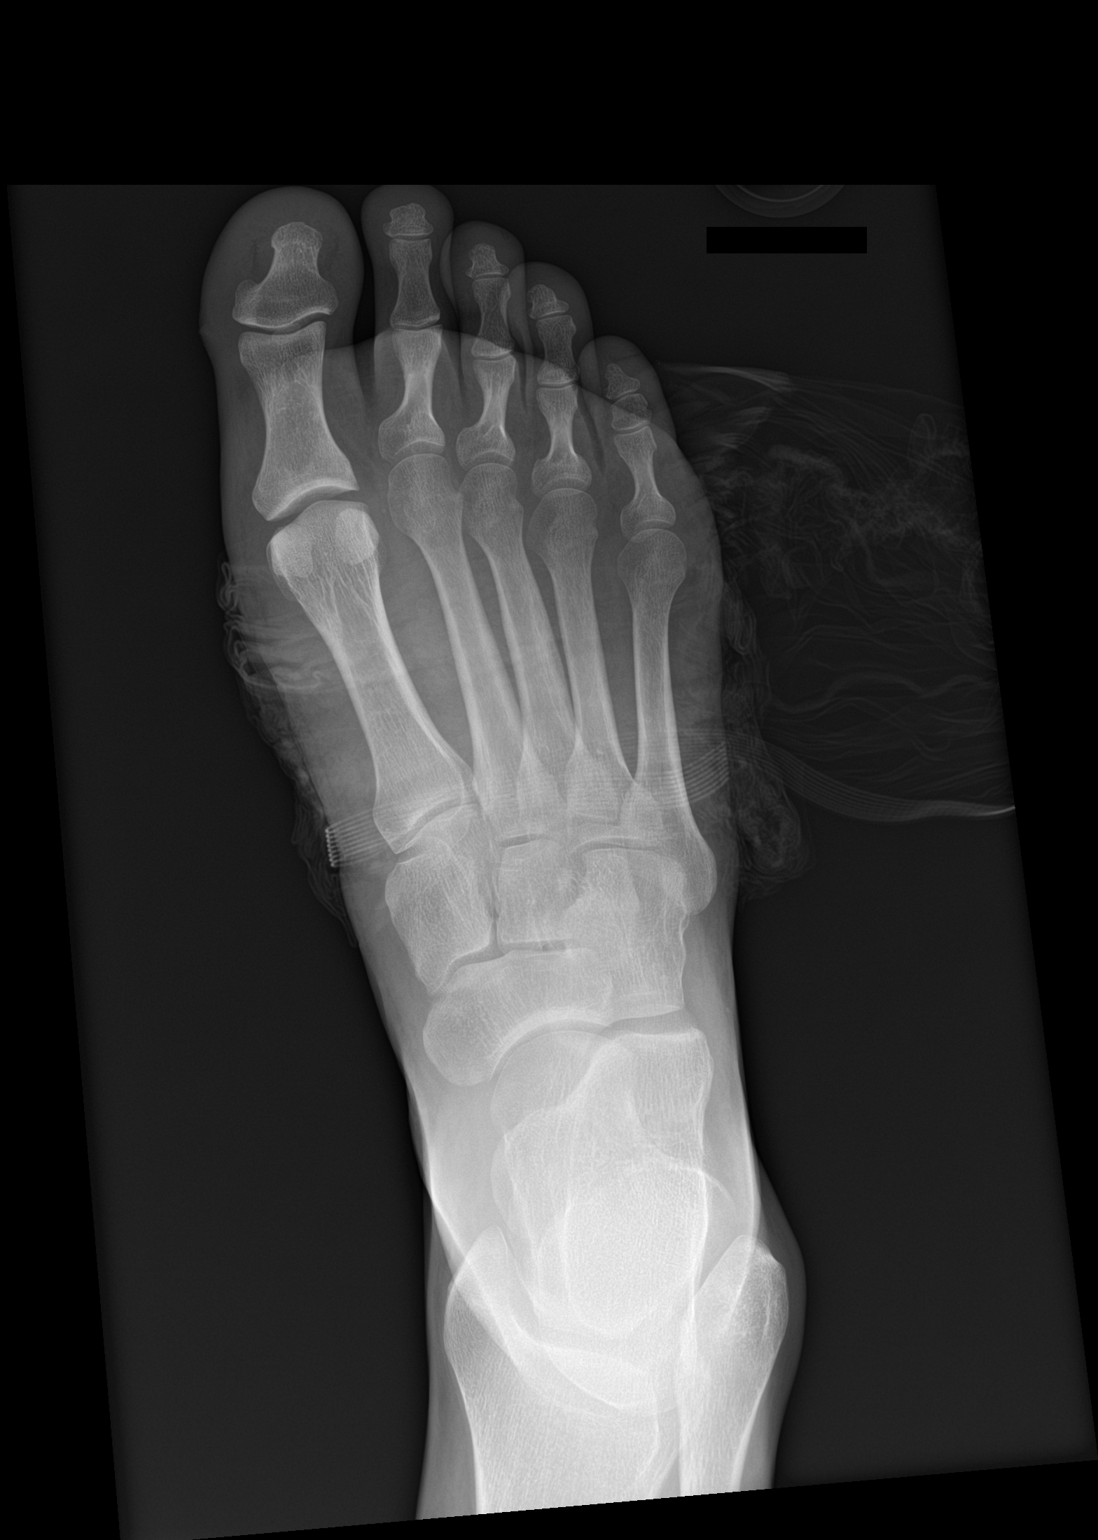

[foot obl]
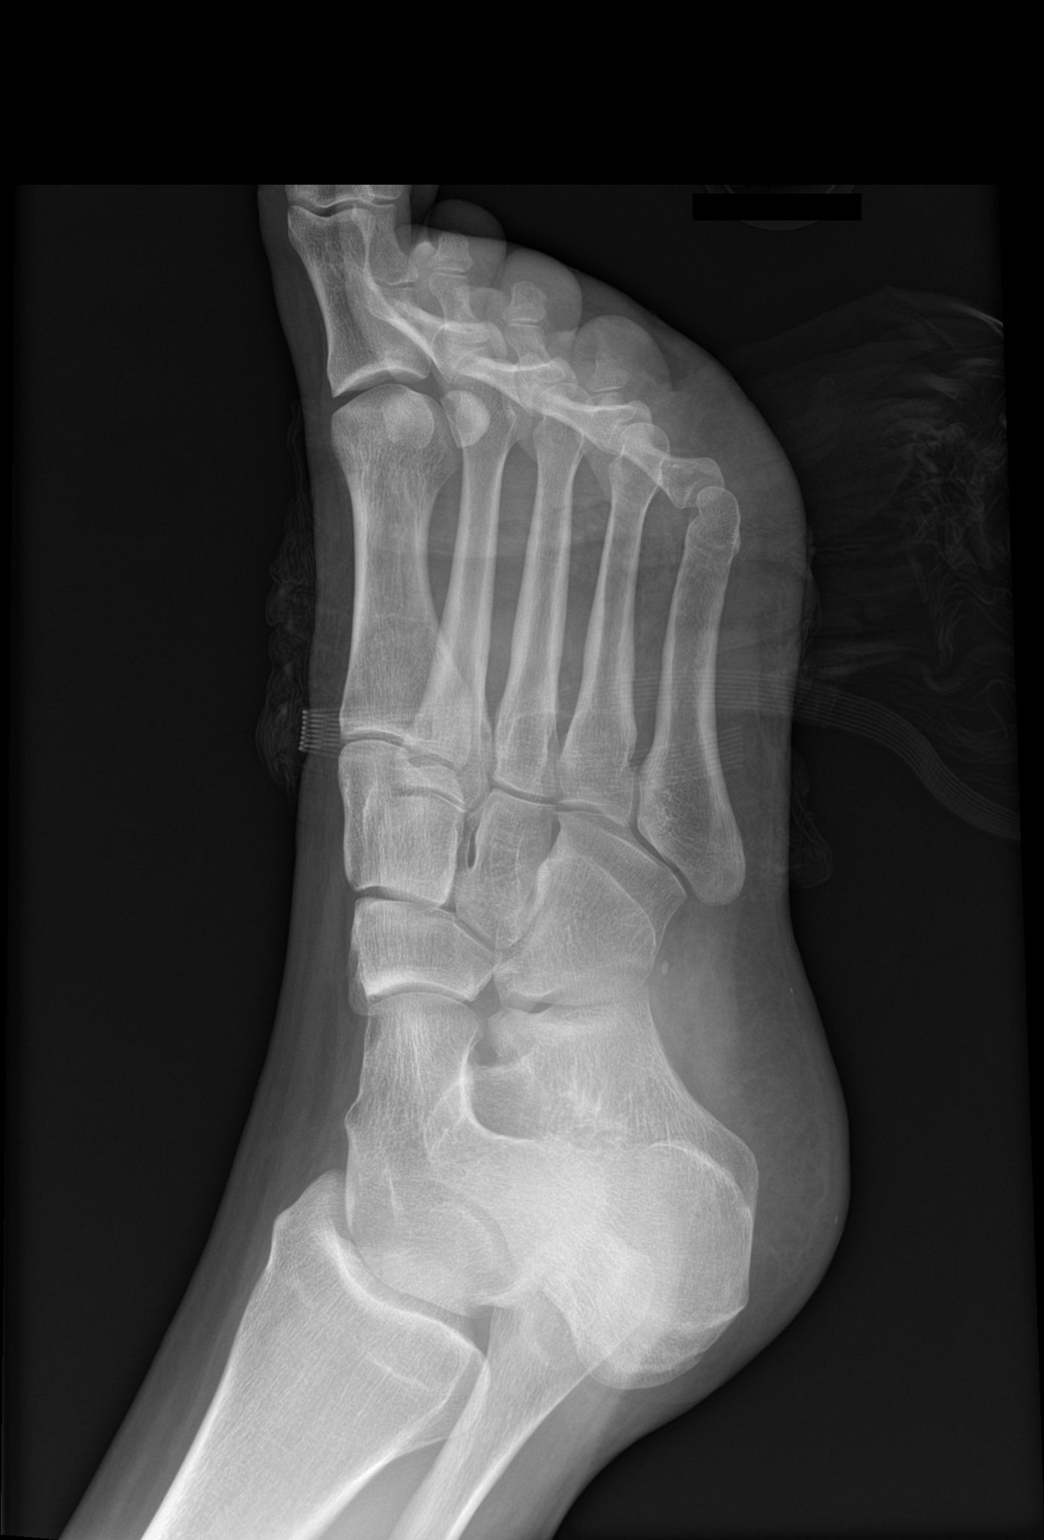

[foot lat]
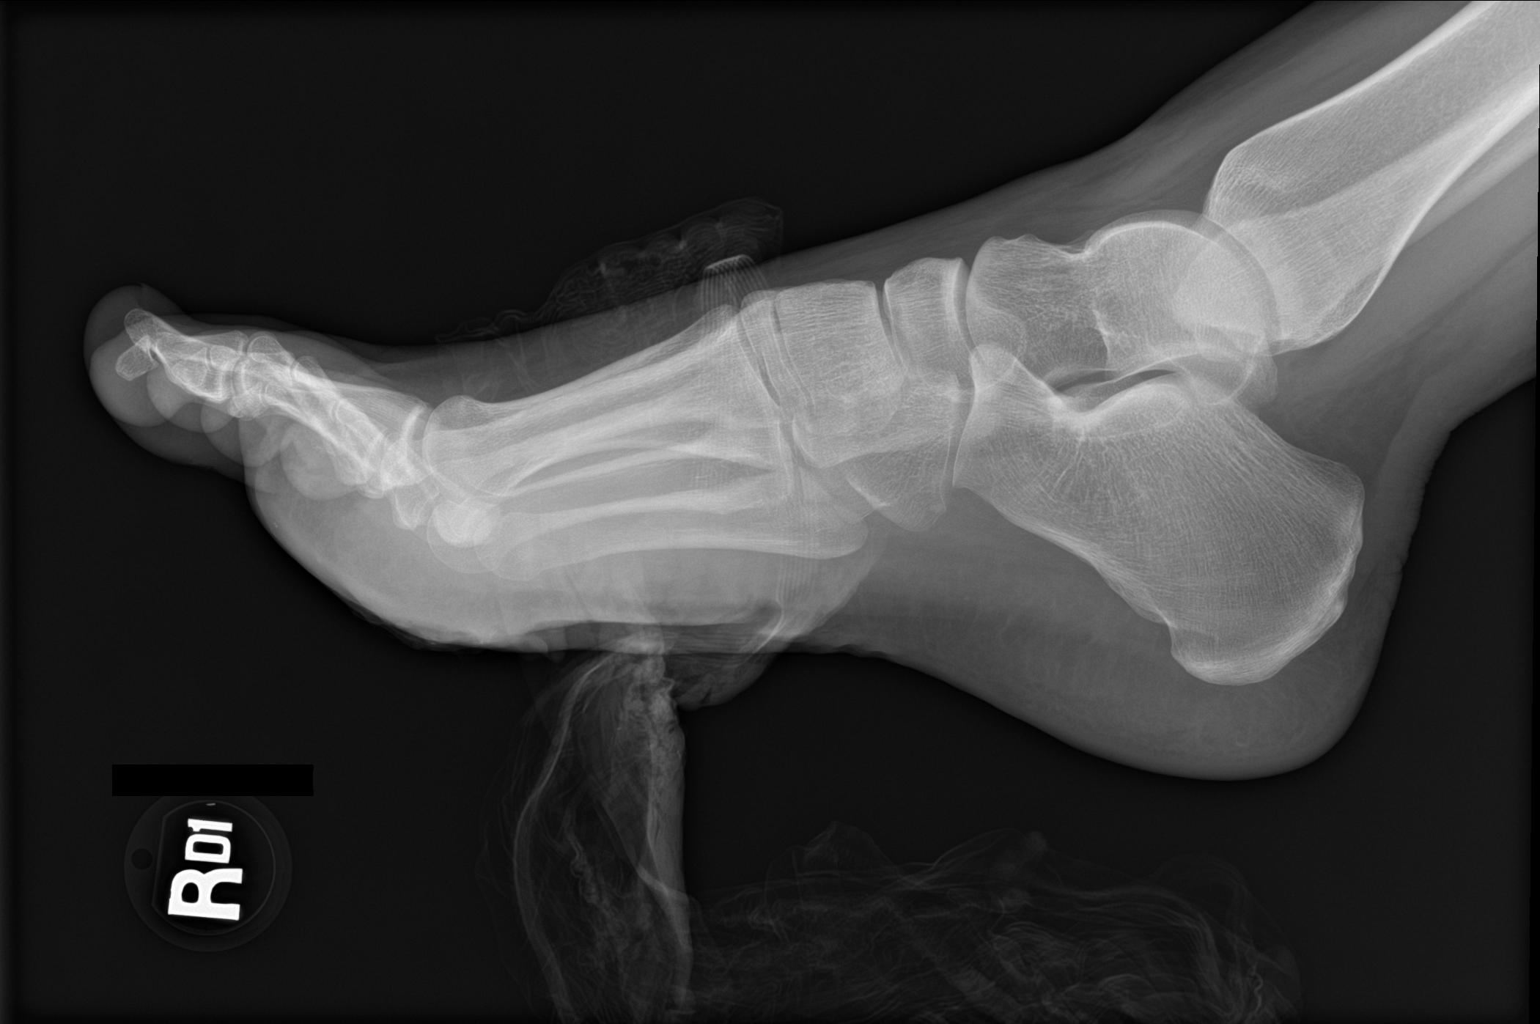

[3 of 3 positions shown; findings below may reference images not displayed]

FINDINGS: No evidence for fracture or dislocation. No worrisome lytic or
sclerotic osseous abnormality. Substantial overlying bandage
material could hinder assessment for subtle radiopaque foreign body,
but within this limitation, no unexpected radiopaque soft tissue
foreign body identified.
IMPRESSION: No acute bony abnormality. No unexpected radiopaque soft tissue
foreign body.
